# Patient Record
Sex: Female | Born: 2000
Health system: Southern US, Community
[De-identification: ages and names within clinical notes are randomized; demographics above are authoritative.]

## PROBLEM LIST (undated history)

## (undated) DIAGNOSIS — R51 Headache: Secondary | ICD-10-CM

## (undated) DIAGNOSIS — E0500 Thyrotoxicosis with diffuse goiter without thyrotoxic crisis or storm: Secondary | ICD-10-CM

## (undated) DIAGNOSIS — T7840XA Allergy, unspecified, initial encounter: Secondary | ICD-10-CM

## (undated) HISTORY — DX: Allergy, unspecified, initial encounter: T78.40XA

## (undated) HISTORY — DX: Thyrotoxicosis with diffuse goiter without thyrotoxic crisis or storm: E05.00

## (undated) HISTORY — DX: Headache: R51

---
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
## 2019-01-16 ENCOUNTER — Other Ambulatory Visit (INDEPENDENT_AMBULATORY_CARE_PROVIDER_SITE_OTHER): Payer: Federal, State, Local not specified - PPO

## 2019-01-16 ENCOUNTER — Other Ambulatory Visit: Payer: Self-pay

## 2019-01-16 DIAGNOSIS — E0590 Thyrotoxicosis, unspecified without thyrotoxic crisis or storm: Secondary | ICD-10-CM | POA: Diagnosis not present

## 2019-01-16 LAB — T4, FREE: Free T4: 0.56 ng/dL — ABNORMAL LOW (ref 0.60–1.60)

## 2019-01-16 LAB — TSH: TSH: 1.65 u[IU]/mL (ref 0.40–5.00)

## 2019-01-19 MED ORDER — METHIMAZOLE 10 MG PO TABS: 10.0000 mg | ORAL_TABLET | Freq: Every day | ORAL | 6 refills | Status: DC

## 2019-02-03 ENCOUNTER — Telehealth: Payer: Self-pay

## 2019-02-03 NOTE — Telephone Encounter (Signed)
Patients mother called in needing a letter written about her condition and diagnose , so she can be able to do remote learning.   Please call and advise for more information

## 2019-02-04 ENCOUNTER — Encounter: Payer: Self-pay | Admitting: Internal Medicine

## 2019-02-04 NOTE — Telephone Encounter (Signed)
Attempted to reach mother. No answer and vm is full. Pt has the same # as mother. Will place letter up front

## 2019-02-05 ENCOUNTER — Telehealth: Payer: Self-pay | Admitting: Internal Medicine

## 2019-02-05 ENCOUNTER — Encounter: Payer: Self-pay | Admitting: Internal Medicine

## 2019-02-05 NOTE — Telephone Encounter (Signed)
Patient's mother [name] ph# [phone] requests to be called re: status of Letter for school re: patient's health condition. [name] has not heard back from our office.

## 2019-02-05 NOTE — Telephone Encounter (Signed)
Called mother because we tried to call yesterday. She is aware that letter was placed up front.

## 2019-02-05 NOTE — Telephone Encounter (Signed)
Closing this encounter please see other telephone encounter

## 2019-02-11 DIAGNOSIS — R430 Anosmia: Secondary | ICD-10-CM | POA: Diagnosis not present

## 2019-02-11 DIAGNOSIS — R05 Cough: Secondary | ICD-10-CM | POA: Diagnosis not present

## 2019-02-18 ENCOUNTER — Telehealth: Payer: Self-pay | Admitting: Family Medicine

## 2019-02-18 NOTE — Telephone Encounter (Signed)
Please advise 

## 2019-02-18 NOTE — Telephone Encounter (Signed)
Copied from [place] [id number]. Topic: General - Other >> Feb 18, 2019 11:00 AM Berneta Levins wrote: Reason for CRM:   Pt's mother calling.  Pt was tested for COVID on 11/25 and positive result on 11/28.  Pt's mother states that pt is having loss of taste and smell at this time and wants to know if there are any recommendations by PCP. [name] can be reached at [phone]

## 2019-02-19 NOTE — Telephone Encounter (Signed)
There is really nothing more she can do except get plenty of rest and wait this out. However she does need to quarantine herself for 14 days after the positive test.

## 2019-02-20 NOTE — Telephone Encounter (Signed)
Pt mother notified of update. Mother stated stated pt has no fever still but other sx.

## 2019-02-22 DIAGNOSIS — Z20828 Contact with and (suspected) exposure to other viral communicable diseases: Secondary | ICD-10-CM | POA: Diagnosis not present

## 2019-03-04 ENCOUNTER — Other Ambulatory Visit: Payer: Self-pay

## 2019-03-06 ENCOUNTER — Ambulatory Visit: Payer: Federal, State, Local not specified - PPO | Admitting: Internal Medicine

## 2019-03-06 VITALS — BP 118/78 | HR 68 | Temp 98.8°F | Ht 66.0 in | Wt 154.8 lb

## 2019-03-06 DIAGNOSIS — E0500 Thyrotoxicosis with diffuse goiter without thyrotoxic crisis or storm: Secondary | ICD-10-CM

## 2019-03-06 DIAGNOSIS — E0590 Thyrotoxicosis, unspecified without thyrotoxic crisis or storm: Secondary | ICD-10-CM | POA: Diagnosis not present

## 2019-03-06 NOTE — Progress Notes (Signed)
Name: Ruth Phillips  MRN/ DOB: [id number], 2000-05-11    Age/ Sex: 18 y.o., female     PCP: Laurey Morale, MD   Reason for Endocrinology Evaluation: Hyperthyroidism     Initial Endocrinology Clinic Visit: 10/13/2018    PATIENT IDENTIFIER: Ruth Phillips is a 18 y.o., female with unremarkable past medical history. She has followed with [facility] since 10/13/2018 for consultative assistance with management of her hyperthyroidism   HISTORICAL SUMMARY:  The patient was first diagnosed with hyperthyroidism Secondary to Graves' Disease in 09/2018  after she noted an enlarging thyroid gland a few months prior to her presentation.   A thyroid ultrasound on 09/25/2018 shows a heterogenous gland with a left inferior nodule , separate from the thyroid gland, a CT scan of the neck demonstrated a17 x 21 mm soft tissue nodule anterior mediastinum just above the left innominate vein and below the left lobe of the thyroid corresponds to the ultrasound abnormality. This was deemed to be a thymus gland.   Methimazole was started 09/2018 Mother with Grave's disease S/P RAI , maternal Grandfather with hyperthyroidism    SUBJECTIVE:   During last visit (10/13/2018): Reduced Methimazole dose to 15 mg (down from 20 mg)   Today (03/09/2019):  Ruth Phillips is here for a follow up on hyperthyroidism secondary to Graves' Disease. She has been taking 1 tablet of methimazole 10 mg tabs. She has been compliant with this.  She denies any side effects such as fever, vomiting or abdominal pain. She continues with irregular BM.  She denies any palpitations.  No anxiety but has occasional tremors in her hands Weight has been stable  No local neck symptoms   ROS:  As per HPI.   HISTORY:  Past Medical History:  Past Medical History:  Diagnosis Date  . Allergy   . WERXVQMG(867.6)     Past Surgical History: No past surgical history on file.  Social History:  reports that she has never  smoked. She has never used smokeless tobacco. She reports that she does not drink alcohol or use drugs. Family History:  Family History  Problem Relation Age of Onset  . Diabetes Mother   . Thyroid disease Mother   . Asthma Father      HOME MEDICATIONS: Allergies as of 03/06/2019   No Known Allergies     Medication List       Accurate as of March 06, 2019 11:59 PM. If you have any questions, ask your nurse or doctor.        methimazole 10 MG tablet Commonly known as: TAPAZOLE Take 1 tablet (10 mg total) by mouth daily.   XYZAL PO Take 1 tablet by mouth at bedtime.         OBJECTIVE:   PHYSICAL EXAM: VS: BP 118/78 (BP Location: Left Arm, Patient Position: Sitting, Cuff Size: Normal)   Pulse 68   Temp 98.8 F (37.1 C)   Ht 5\' 6"  (1.676 m)   Wt 154 lb 12.8 oz (70.2 kg)   SpO2 98%   BMI 24.99 kg/m    EXAM: General: Pt appears well and is in NAD  Neck: General: Supple without adenopathy. Thyroid: Thyroid size ~ 40 grams .  No nodules appreciated.   Lungs: Clear with good BS bilat with no rales, rhonchi, or wheezes  Heart: Auscultation: RRR.  Abdomen: Normoactive bowel sounds, soft, nontender, without masses or organomegaly palpable  Extremities:  BL LE: No pretibial edema normal ROM and strength.  Mental Status: Judgment, insight: Intact Orientation: Oriented to time, place, and person Mood and affect: No depression, anxiety, or agitation     DATA REVIEWED:  Results for CHENEL, WERNLI (MRN [id number]) as of 03/09/2019 09:26  Ref. Range 03/06/2019 15:33  TSH Latest Units: mIU/L 2.73  T4,Free(Direct) Latest Ref Range: 0.8 - 1.4 ng/dL 0.9    Results for ELSY, CHIANG (MRN [id number]) as of 12/08/2018 10:46  Ref. Range 10/10/2018 15:11  TRAB Latest Ref Range: <=2.00 IU/L 11.36 (H)    ASSESSMENT / PLAN / RECOMMENDATIONS:   1. Hyperthyroidism Secondary to Graves' Disease:    - Clinically she is euthyroid  - Tolerating methimazole well  -  Today's labs show normal TSh and FT4, will continue current dose of methimazole   Medications   Methimazole 10 mg daily   2. Graves' Disease:  - No extra thyroidal manifestations of graves' disease.     Labs in 8 weeks   F/U in 4 months    Signed electronically by: Lyndle Herrlich, MD  [facility] Endocrinology  [facility] [address] [place] [place], [place] [address] Phone: [phone] FAX: [phone]      CC: Nelwyn Salisbury, MD [address] [place] [address] Phone: [phone]  Fax: [phone]   Return to Endocrinology clinic as below: Future Appointments  Date Time Provider Department Center  05/01/2019  2:00 PM [facility] None  07/10/2019  2:00 PM Leatta Alewine, Konrad Dolores, MD [facility] None

## 2019-03-06 NOTE — Patient Instructions (Signed)
-   Continue Methimazole 10 mg , at one tablet daily until we contact you next week with the results

## 2019-03-07 LAB — T4, FREE: Free T4: 0.9 ng/dL (ref 0.8–1.4)

## 2019-03-07 LAB — TSH: TSH: 2.73 mIU/L

## 2019-03-09 ENCOUNTER — Encounter: Payer: Self-pay | Admitting: Internal Medicine

## 2019-03-09 MED ORDER — METHIMAZOLE 10 MG PO TABS: 10.0000 mg | ORAL_TABLET | Freq: Every day | ORAL | 3 refills | Status: DC

## 2019-05-01 ENCOUNTER — Other Ambulatory Visit: Payer: Self-pay

## 2019-05-01 ENCOUNTER — Other Ambulatory Visit (INDEPENDENT_AMBULATORY_CARE_PROVIDER_SITE_OTHER): Payer: Federal, State, Local not specified - PPO

## 2019-05-01 DIAGNOSIS — E0590 Thyrotoxicosis, unspecified without thyrotoxic crisis or storm: Secondary | ICD-10-CM | POA: Diagnosis not present

## 2019-05-02 LAB — T4, FREE: Free T4: 0.9 ng/dL (ref 0.8–1.4)

## 2019-05-02 LAB — TSH: TSH: 6.36 mIU/L — ABNORMAL HIGH

## 2019-05-04 ENCOUNTER — Telehealth: Payer: Self-pay | Admitting: Internal Medicine

## 2019-05-04 MED ORDER — METHIMAZOLE 5 MG PO TABS: 5.0000 mg | ORAL_TABLET | Freq: Every day | ORAL | 4 refills | Status: DC

## 2019-05-04 NOTE — Telephone Encounter (Signed)
The current dose of methimazole is a bit too much for you. Please start taking half a tablet of the current dose of methimazole 10 mg, once you are done with this bottle , the new prescription will be the 5 mg dose and please start taking ONE tablet daily of that bottle.      Abby Raelyn Mora, MD  [facility] Endocrinology  [facility] [address] [place], [place] [address] Phone: [phone] FAX: [phone]

## 2019-06-20 ENCOUNTER — Ambulatory Visit: Payer: Federal, State, Local not specified - PPO | Attending: Internal Medicine

## 2019-06-20 DIAGNOSIS — Z23 Encounter for immunization: Secondary | ICD-10-CM

## 2019-06-20 NOTE — Progress Notes (Signed)
   Covid-19 Vaccination Clinic  Name:  Ruth Phillips    MRN: [id number] DOB: [birth year]  06/20/2019  [name] was observed post Covid-19 immunization for 15 minutes without incident. She was provided with Vaccine Information Sheet and instruction to access the V-Safe system.   [name] was instructed to call 911 with any severe reactions post vaccine: Marland Kitchen Difficulty breathing  . Swelling of face and throat  . A fast heartbeat  . A bad rash all over body  . Dizziness and weakness   Immunizations Administered    Name Date Dose VIS Date Route   Pfizer COVID-19 Vaccine 06/20/2019  9:34 AM 0.3 mL 02/27/2019 Intramuscular   Manufacturer: [employer], [employer]   Lot: [id number]   NDC: [address]-5

## 2019-07-10 ENCOUNTER — Other Ambulatory Visit: Payer: Self-pay

## 2019-07-10 ENCOUNTER — Encounter: Payer: Self-pay | Admitting: Internal Medicine

## 2019-07-10 ENCOUNTER — Ambulatory Visit: Payer: Federal, State, Local not specified - PPO | Admitting: Internal Medicine

## 2019-07-10 VITALS — BP 122/80 | HR 64 | Temp 98.2°F | Ht 66.0 in | Wt 150.6 lb

## 2019-07-10 DIAGNOSIS — E0590 Thyrotoxicosis, unspecified without thyrotoxic crisis or storm: Secondary | ICD-10-CM | POA: Diagnosis not present

## 2019-07-10 LAB — T4, FREE: Free T4: 0.87 ng/dL (ref 0.60–1.60)

## 2019-07-10 LAB — TSH: TSH: 1.98 u[IU]/mL (ref 0.40–5.00)

## 2019-07-10 NOTE — Progress Notes (Signed)
Name: Ruth Phillips  MRN/ DOB: [id number], 2000/04/29    Age/ Sex: 19 y.o., female     PCP: Nelwyn Salisbury, MD   Reason for Endocrinology Evaluation: Hyperthyroidism     Initial Endocrinology Clinic Visit: 10/13/2018    PATIENT IDENTIFIER: Ruth Phillips is a 19 y.o., female, female with unremarkable past medical history. She has followed with [facility] since 10/13/2018 for consultative assistance with management of her hyperthyroidism   HISTORICAL SUMMARY:  The patient was first diagnosed with hyperthyroidism Secondary to Graves' Disease in 09/2018  after she noted an enlarging thyroid gland a few months prior to her presentation.   A thyroid ultrasound on 09/25/2018 shows a heterogenous gland with a left inferior nodule , separate from the thyroid gland, a CT scan of the neck demonstrated a17 x 21 mm soft tissue nodule anterior mediastinum just above the left innominate vein and below the left lobe of the thyroid corresponds to the ultrasound abnormality. This was deemed to be a thymus gland.   Methimazole was started 09/2018 Mother with Grave's disease S/P RAI , maternal Grandfather with hyperthyroidism    SUBJECTIVE:   During last visit (03/08/2019): Reduced Methimazole dose to 5 mg   Today (07/10/2019):  Ruth Phillips is here for a follow up on hyperthyroidism secondary to Graves' Disease. She has been taking 1 tablet of methimazole 5 mg tabs. She has been compliant with this.  Has noted metallic taste  That she attributes to methimazole  Has noted decreased appetite with palpitations  Denies diarrhea  Has jittery sensation  Has noted increase in anterior neck swelling   ROS:  As per HPI.   HISTORY:  Past Medical History:  Past Medical History:  Diagnosis Date  . Allergy   . FUXNATFT(732.2)     Past Surgical History: No past surgical history on file.  Social History:  reports that she has never smoked. She has never used smokeless tobacco. She reports that  she does not drink alcohol or use drugs. Family History:  Family History  Problem Relation Age of Onset  . Diabetes Mother   . Thyroid disease Mother   . Asthma Father      HOME MEDICATIONS: Allergies as of 07/10/2019   No Known Allergies     Medication List       Accurate as of July 10, 2019  4:23 PM. If you have any questions, ask your nurse or doctor.        methimazole 5 MG tablet Commonly known as: TAPAZOLE Take 1 tablet (5 mg total) by mouth daily.   XYZAL PO Take 1 tablet by mouth at bedtime.         OBJECTIVE:   PHYSICAL EXAM: VS: BP 122/80 (BP Location: Left Arm, Patient Position: Sitting, Cuff Size: Normal)   Pulse 64   Temp 98.2 F (36.8 C)   Ht 5\' 6"  (1.676 m)   Wt 150 lb 9.6 oz (68.3 kg)   SpO2 96%   BMI 24.31 kg/m    EXAM: General: Pt appears well and is in NAD  Neck: General: Supple without adenopathy. Thyroid: Thyroid size ~ 40 grams .  No nodules appreciated.   Lungs: Clear with good BS bilat with no rales, rhonchi, or wheezes  Heart: Auscultation: RRR.  Abdomen: Normoactive bowel sounds, soft, nontender, without masses or organomegaly palpable  Extremities:  BL LE: No pretibial edema normal ROM and strength.  Mental Status: Judgment, insight: Intact Orientation: Oriented to time, place, and person  Mood and affect: No depression, anxiety, or agitation     DATA REVIEWED:  Results for JANNETH, KRASNER (MRN [id number]) as of 03/09/2019 09:26  Ref. Range 03/06/2019 15:33  TSH Latest Units: mIU/L 2.73  T4,Free(Direct) Latest Ref Range: 0.8 - 1.4 ng/dL 0.9    Results for CHANDELL, ATTRIDGE (MRN [id number]) as of 12/08/2018 10:46  Ref. Range 10/10/2018 15:11  TRAB Latest Ref Range: <=2.00 IU/L 11.36 (H)    ASSESSMENT / PLAN / RECOMMENDATIONS:   1. Hyperthyroidism Secondary to Graves' Disease:   -Patient is having a metallic taste in her mouth that she attributes to methimazole - Today's labs show normal TFTs -I have advised her to  take the methimazole with a meal, and see if that would help with the metallic taste.  If she continues to have an issue with a metallic taste, the other option is to switch to PTU.  We did discuss that PTU has a high risk of hepatic injury compared to methimazole.  Medications   Methimazole 5 mg daily   2. Graves' Disease:  - No extra thyroidal manifestations of graves' disease.     Labs in 8 weeks   F/U in 4 months    Signed electronically by: Mack Guise, MD  [facility] Endocrinology  [facility] [address] [place], [place] [address] Phone: [phone] FAX: [phone]      CC: Laurey Morale, [place] [address] Phone: [phone]  Fax: [phone]   Return to Endocrinology clinic as below: Future Appointments  Date Time Provider [place]  07/15/2019  2:00 PM [place] [facility]  09/09/2019  2:15 PM [facility] None  11/13/2019  3:40 PM Danasia Baker, Melanie Crazier, MD [facility] None

## 2019-07-10 NOTE — Patient Instructions (Signed)
-   Continue Methimazole 5 mg, daily  

## 2019-07-15 ENCOUNTER — Ambulatory Visit: Payer: Federal, State, Local not specified - PPO | Attending: Internal Medicine

## 2019-07-15 DIAGNOSIS — Z23 Encounter for immunization: Secondary | ICD-10-CM

## 2019-07-15 NOTE — Progress Notes (Signed)
   Covid-19 Vaccination Clinic  Name:  Ruth Phillips    MRN: [id number] DOB: [birth year]  07/15/2019  [name] was observed post Covid-19 immunization for 15 minutes without incident. She was provided with Vaccine Information Sheet and instruction to access the V-Safe system.   [name] was instructed to call 911 with any severe reactions post vaccine: Marland Kitchen Difficulty breathing  . Swelling of face and throat  . A fast heartbeat  . A bad rash all over body  . Dizziness and weakness   Immunizations Administered    Name Date Dose VIS Date Route   Pfizer COVID-19 Vaccine 07/15/2019  2:12 PM 0.3 mL 05/13/2018 Intramuscular   Manufacturer: [employer], [employer]   Lot: [id number]   NDC: [address]-2

## 2019-08-21 DIAGNOSIS — K080 Exfoliation of teeth due to systemic causes: Secondary | ICD-10-CM | POA: Diagnosis not present

## 2019-08-27 ENCOUNTER — Ambulatory Visit (INDEPENDENT_AMBULATORY_CARE_PROVIDER_SITE_OTHER): Payer: Federal, State, Local not specified - PPO | Admitting: Family Medicine

## 2019-08-27 ENCOUNTER — Other Ambulatory Visit (INDEPENDENT_AMBULATORY_CARE_PROVIDER_SITE_OTHER): Payer: Federal, State, Local not specified - PPO

## 2019-08-27 ENCOUNTER — Encounter: Payer: Self-pay | Admitting: Family Medicine

## 2019-08-27 ENCOUNTER — Other Ambulatory Visit: Payer: Self-pay

## 2019-08-27 VITALS — BP 120/64 | HR 109 | Temp 97.1°F | Wt 152.6 lb

## 2019-08-27 DIAGNOSIS — Z0000 Encounter for general adult medical examination without abnormal findings: Secondary | ICD-10-CM

## 2019-08-27 DIAGNOSIS — Z23 Encounter for immunization: Secondary | ICD-10-CM | POA: Diagnosis not present

## 2019-08-27 LAB — CBC WITH DIFFERENTIAL/PLATELET
Basophils Absolute: 0 10*3/uL (ref 0.0–0.1)
Basophils Relative: 0.4 % (ref 0.0–3.0)
Eosinophils Absolute: 0.3 10*3/uL (ref 0.0–0.7)
Eosinophils Relative: 4.8 % (ref 0.0–5.0)
HCT: 38.5 % (ref 36.0–49.0)
Hemoglobin: 13.4 g/dL (ref 12.0–16.0)
Lymphocytes Relative: 33.9 % (ref 24.0–48.0)
Lymphs Abs: 2.4 10*3/uL (ref 0.7–4.0)
MCHC: 35 g/dL (ref 31.0–37.0)
MCV: 88.2 fl (ref 78.0–98.0)
Monocytes Absolute: 0.7 10*3/uL (ref 0.1–1.0)
Monocytes Relative: 9.6 % (ref 3.0–12.0)
Neutro Abs: 3.6 10*3/uL (ref 1.4–7.7)
Neutrophils Relative %: 51.3 % (ref 43.0–71.0)
Platelets: 252 10*3/uL (ref 150.0–575.0)
RBC: 4.36 Mil/uL (ref 3.80–5.70)
RDW: 12.8 % (ref 11.4–15.5)
WBC: 7 10*3/uL (ref 4.5–13.5)

## 2019-08-27 LAB — HEPATIC FUNCTION PANEL
ALT: 10 U/L (ref 0–35)
AST: 16 U/L (ref 0–37)
Albumin: 4.6 g/dL (ref 3.5–5.2)
Alkaline Phosphatase: 65 U/L (ref 47–119)
Bilirubin, Direct: 0.1 mg/dL (ref 0.0–0.3)
Total Bilirubin: 0.3 mg/dL (ref 0.2–1.2)
Total Protein: 7.6 g/dL (ref 6.0–8.3)

## 2019-08-27 LAB — LIPID PANEL
Cholesterol: 137 mg/dL (ref 0–200)
HDL: 46.4 mg/dL (ref >39.00)
LDL Cholesterol: 78 mg/dL (ref 0–99)
NonHDL: 90.29
Total CHOL/HDL Ratio: 3
Triglycerides: 62 mg/dL (ref 0.0–149.0)
VLDL: 12.4 mg/dL (ref 0.0–40.0)

## 2019-08-27 LAB — BASIC METABOLIC PANEL
BUN: 12 mg/dL (ref 6–23)
CO2: 25 mEq/L (ref 19–32)
Calcium: 9.5 mg/dL (ref 8.4–10.5)
Chloride: 103 mEq/L (ref 96–112)
Creatinine, Ser: 0.8 mg/dL (ref 0.40–1.20)
GFR: 111.15 mL/min (ref >60.00)
Glucose, Bld: 95 mg/dL (ref 70–99)
Potassium: 3.5 mEq/L (ref 3.5–5.1)
Sodium: 135 mEq/L (ref 135–145)

## 2019-08-27 NOTE — Addendum Note (Signed)
Addended by: Solon Augusta on: 08/27/2019 08:34 AM   Modules accepted: Orders

## 2019-08-27 NOTE — Progress Notes (Signed)
Subjective:    Patient ID: Ruth Phillips, female    DOB: 05/05/2000, 19 y.o.   MRN: [id number]  HPI Here with her mother for a well exam. She feels great in general. She saw Dr. Lonzo Cloud in April for the Grave's disease, and she is doing well. Her weight is stable and all her thyroid levels are normal. However the Methimazole causes an intense metallic taste in her mouth. 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 Her menses are regular. She enjoys yoga and pilates.    Review of Systems  Constitutional: Negative.   HENT: Negative.   Eyes: Negative.   Respiratory: Negative.   Cardiovascular: Negative.   Gastrointestinal: Negative.   Genitourinary: Negative for decreased urine volume, difficulty urinating, dyspareunia, dysuria, enuresis, flank pain, frequency, hematuria, pelvic pain and urgency.  Musculoskeletal: Negative.   Skin: Negative.   Neurological: Negative.   Psychiatric/Behavioral: Negative.        Objective:   Physical Exam Constitutional:      General: She is not in acute distress.    Appearance: She is well-developed.  HENT:     Head: Normocephalic and atraumatic.     Right Ear: External ear normal.     Left Ear: External ear normal.     Nose: Nose normal.     Mouth/Throat:     Pharynx: No oropharyngeal exudate.  Eyes:     General: No scleral icterus.    Conjunctiva/sclera: Conjunctivae normal.     Pupils: Pupils are equal, round, and reactive to light.  Neck:     Thyroid: No thyromegaly.     Vascular: No JVD.     Comments: Her goiter is stable, not tender  Cardiovascular:     Rate and Rhythm: Normal rate and regular rhythm.     Heart sounds: Normal heart sounds. No murmur heard.  No friction rub. No gallop.   Pulmonary:     Effort: Pulmonary effort is normal. No respiratory distress.     Breath sounds: Normal breath sounds. No wheezing or rales.  Chest:     Chest wall: No tenderness.  Abdominal:      General: Bowel sounds are normal. There is no distension.     Palpations: Abdomen is soft. There is no mass.     Tenderness: There is no abdominal tenderness. There is no guarding or rebound.  Musculoskeletal:        General: No tenderness. Normal range of motion.     Cervical back: Normal range of motion and neck supple.  Lymphadenopathy:     Cervical: No cervical adenopathy.  Skin:    General: Skin is warm and dry.     Findings: No erythema or rash.  Neurological:     Mental Status: She is alert and oriented to person, place, and time.     Cranial Nerves: No cranial nerve deficit.     Motor: No abnormal muscle tone.     Coordination: Coordination normal.     Deep Tendon Reflexes: Reflexes are normal and symmetric. Reflexes normal.  Psychiatric:        Behavior: Behavior normal.        Thought Content: Thought content normal.        Judgment: Judgment normal.           Assessment & Plan:  Well exam. We discussed diet and exercise. Get fasting labs. Given her second HPV vaccine. I advised her to talk  to Dr. Kelton Pillar about changing to PTU.  Alysia Penna, MD

## 2019-09-09 ENCOUNTER — Other Ambulatory Visit: Payer: Self-pay

## 2019-09-09 ENCOUNTER — Other Ambulatory Visit (INDEPENDENT_AMBULATORY_CARE_PROVIDER_SITE_OTHER): Payer: Federal, State, Local not specified - PPO

## 2019-09-09 DIAGNOSIS — E0590 Thyrotoxicosis, unspecified without thyrotoxic crisis or storm: Secondary | ICD-10-CM

## 2019-09-09 LAB — TSH: TSH: 1.64 u[IU]/mL (ref 0.40–5.00)

## 2019-09-09 LAB — T4, FREE: Free T4: 0.9 ng/dL (ref 0.60–1.60)

## 2019-09-16 ENCOUNTER — Telehealth: Payer: Self-pay | Admitting: Internal Medicine

## 2019-09-16 MED ORDER — PROPYLTHIOURACIL 50 MG PO TABS: 50.0000 mg | ORAL_TABLET | Freq: Every day | ORAL | 6 refills | Status: DC

## 2019-09-16 NOTE — Telephone Encounter (Signed)
Mother calling to let us know pt is asking for [place] to place an ENT referral and to change her medication to a PTU medication.  Mother is on DPR  Mother states She was told by Dr. Lonzo Cloud that if this was something the patient wanted we would do it.

## 2019-09-16 NOTE — Telephone Encounter (Signed)
Attempted to call mother # twice and no answer also her mailbox is full.

## 2019-09-17 NOTE — Telephone Encounter (Signed)
Patient's mother called stating she had not gotten a call yet and has been waiting. Informed her that [name] has tried twice to call and no voicemail. Ph# [phone]

## 2019-09-18 NOTE — Telephone Encounter (Signed)
Spoke to pt's mother and informed her of new medication and that her daughter would need to see PCP for referral to ENT.

## 2019-11-13 ENCOUNTER — Ambulatory Visit: Payer: Federal, State, Local not specified - PPO | Admitting: Internal Medicine

## 2019-11-13 ENCOUNTER — Encounter: Payer: Self-pay | Admitting: Internal Medicine

## 2019-11-13 ENCOUNTER — Other Ambulatory Visit: Payer: Self-pay

## 2019-11-13 VITALS — BP 118/62 | HR 68 | Ht 66.0 in | Wt 147.0 lb

## 2019-11-13 DIAGNOSIS — E0590 Thyrotoxicosis, unspecified without thyrotoxic crisis or storm: Secondary | ICD-10-CM

## 2019-11-13 DIAGNOSIS — E0500 Thyrotoxicosis with diffuse goiter without thyrotoxic crisis or storm: Secondary | ICD-10-CM

## 2019-11-13 NOTE — Patient Instructions (Signed)
-   Continue PTU 50 mg, 1 tablet daily for now

## 2019-11-13 NOTE — Progress Notes (Signed)
Name: Ruth Phillips  MRN/ DOB: [id number], 03-Sep-2000    Age/ Sex: 19 y.o., female     PCP: Nelwyn Salisbury, MD   Reason for Endocrinology Evaluation: Hyperthyroidism     Initial Endocrinology Clinic Visit: 10/13/2018    PATIENT IDENTIFIER: Ruth Phillips is a 20 y.o., female with unremarkable past medical history. She has followed with [facility] since 10/13/2018 for consultative assistance with management of her hyperthyroidism   HISTORICAL SUMMARY:  The patient was first diagnosed with hyperthyroidism Secondary to Graves' Disease in 09/2018  after she noted an enlarging thyroid gland a few months prior to her presentation.   A thyroid ultrasound on 09/25/2018 shows a heterogenous gland with a left inferior nodule , separate from the thyroid gland, a CT scan of the neck demonstrated a17 x 21 mm soft tissue nodule anterior mediastinum just above the left innominate vein and below the left lobe of the thyroid corresponds to the ultrasound abnormality. This was deemed to be a thymus gland.   Methimazole was started 09/2018 , switched to PTU in 08/2019 due to metalic taste Mother with Grave's disease S/P RAI , maternal Grandfather with hyperthyroidism    SUBJECTIVE:    Today (11/13/2019):  Ruth Phillips is here for a follow up on hyperthyroidism secondary to Graves' Disease. She has been taking 1 tablet of PTU.  She has been compliant with this.   Weight has been stable    Denies palpitations Occasional abdominal pain , and diarrhea started 2 months ago  Denies anxiety  Denies neck swelling.     Studies political science  HISTORY:  Past Medical History:  Past Medical History:  Diagnosis Date   Allergy    Headache(784.0)     Past Surgical History: No past surgical history on file.  Social History:  reports that she has never smoked. She has never used smokeless tobacco. She reports that she does not drink alcohol and does not use drugs. Family History:   Family History  Problem Relation Age of Onset   Diabetes Mother    Thyroid disease Mother    Asthma Father      HOME MEDICATIONS: Allergies as of 11/13/2019   No Known Allergies     Medication List       Accurate as of November 13, 2019  3:56 PM. If you have any questions, ask your nurse or doctor.        propylthiouracil 50 MG tablet Commonly known as: PTU Take 1 tablet (50 mg total) by mouth daily.   XYZAL PO Take 1 tablet by mouth at bedtime.         OBJECTIVE:   PHYSICAL EXAM: VS: There were no vitals taken for this visit.   EXAM: General: Pt appears well and is in NAD  Neck: General: Supple without adenopathy. Thyroid: Thyroid size ~ 40 grams .  No nodules appreciated.   Lungs: Clear with good BS bilat with no rales, rhonchi, or wheezes  Heart: Auscultation: RRR.  Abdomen: Normoactive bowel sounds, soft, nontender, without masses or organomegaly palpable  Extremities:  BL LE: No pretibial edema normal ROM and strength.  Mental Status: Judgment, insight: Intact Orientation: Oriented to time, place, and person Mood and affect: No depression, anxiety, or agitation     DATA REVIEWED: Results for Ruth Phillips, Ruth Phillips (MRN [id number]) as of 11/15/2019 14:30  Ref. Range 11/13/2019 16:38  Sodium Latest Ref Range: 135 - 146 mmol/L 141  Potassium Latest Ref Range: 3.8 - 5.1 mmol/L  3.7 (L)  Chloride Latest Ref Range: 98 - 110 mmol/L 105  CO2 Latest Ref Range: 20 - 32 mmol/L 26  Glucose Latest Ref Range: 65 - 99 mg/dL 81  BUN Latest Ref Range: 7 - 20 mg/dL 8  Creatinine Latest Ref Range: 0.50 - 1.00 mg/dL 9.32  Calcium Latest Ref Range: 8.9 - 10.4 mg/dL 9.5  BUN/Creatinine Ratio Latest Ref Range: 6 - 22 (calc) NOT APPLICABLE  AG Ratio Latest Ref Range: 1.0 - 2.5 (calc) 1.8  AST Latest Ref Range: 12 - 32 U/L 18  ALT Latest Ref Range: 5 - 32 U/L 12  Total Protein Latest Ref Range: 6.3 - 8.2 g/dL 7.2  Total Bilirubin Latest Ref Range: 0.2 - 1.1 mg/dL 0.6  Alkaline  phosphatase (APISO) Latest Ref Range: 36 - 128 U/L 50  Globulin Latest Ref Range: 2.0 - 3.8 g/dL (calc) 2.6  TSH Latest Units: mIU/L 0.94  T4,Free(Direct) Latest Ref Range: 0.8 - 1.4 ng/dL 1.3  Albumin MSPROF Latest Ref Range: 3.6 - 5.1 g/dL 4.6     Results for Ruth Phillips, Ruth Phillips (MRN [id number]) as of 12/08/2018 10:46  Ref. Range 10/10/2018 15:11  TRAB Latest Ref Range: <=2.00 IU/L 11.36 (H)    ASSESSMENT / PLAN / RECOMMENDATIONS:   1. Hyperthyroidism Secondary to Graves' Disease:   -Patient is having a metallic taste in her mouth that she attributes to methimazole - Today's labs show normal TFTs -I have advised her to take the methimazole with a meal, and see if that would help with the metallic taste.  If she continues to have an issue with a metallic taste, the other option is to switch to PTU.  We did discuss that PTU has a high risk of hepatic injury compared to methimazole.  Medications  Continue PTU 50 mg daily      2. Graves' Disease:  - No extra thyroidal manifestations of graves' disease.     Labs in 8 weeks   F/U in 4 months    Signed electronically by: Lyndle Herrlich, MD  [facility] Endocrinology  [facility] [address] [place] [place], [place] [address] Phone: [phone] FAX: [phone]      CC: Nelwyn Salisbury, MD [address] [place] [address] Phone: [phone]  Fax: [phone]   Return to Endocrinology clinic as below: No future appointments.

## 2019-11-14 LAB — COMPREHENSIVE METABOLIC PANEL
AG Ratio: 1.8 (calc) (ref 1.0–2.5)
ALT: 12 U/L (ref 5–32)
AST: 18 U/L (ref 12–32)
Albumin: 4.6 g/dL (ref 3.6–5.1)
Alkaline phosphatase (APISO): 50 U/L (ref 36–128)
BUN: 8 mg/dL (ref 7–20)
CO2: 26 mmol/L (ref 20–32)
Calcium: 9.5 mg/dL (ref 8.9–10.4)
Chloride: 105 mmol/L (ref 98–110)
Creat: 0.76 mg/dL (ref 0.50–1.00)
Globulin: 2.6 g/dL (calc) (ref 2.0–3.8)
Glucose, Bld: 81 mg/dL (ref 65–99)
Potassium: 3.7 mmol/L — ABNORMAL LOW (ref 3.8–5.1)
Sodium: 141 mmol/L (ref 135–146)
Total Bilirubin: 0.6 mg/dL (ref 0.2–1.1)
Total Protein: 7.2 g/dL (ref 6.3–8.2)

## 2019-11-14 LAB — TSH: TSH: 0.94 mIU/L

## 2019-11-14 LAB — T4, FREE: Free T4: 1.3 ng/dL (ref 0.8–1.4)

## 2019-11-15 MED ORDER — PROPYLTHIOURACIL 50 MG PO TABS
50.0000 mg | ORAL_TABLET | Freq: Every day | ORAL | 1 refills | Status: DC
Start: 2019-11-15 — End: 2020-08-01

## 2019-11-16 ENCOUNTER — Telehealth: Payer: Self-pay | Admitting: Internal Medicine

## 2019-11-16 NOTE — Telephone Encounter (Signed)
Patient's mother Almedia Balls requests to be called at ph# [phone] re: concerns after receiving Lab Results on MyChart.

## 2019-11-16 NOTE — Telephone Encounter (Signed)
Called pt's mother and read her Dr. Harvel Ricks results note. Pt's mother stated that the daughter did not tell her that this note was there, but suggested that she did not see the labs. Pt's mother verbalized understanding, and stated that she would check with the pt again to see if the note from Dr. Lonzo Cloud is visible to her in Mychart.

## 2020-01-15 ENCOUNTER — Other Ambulatory Visit: Payer: Self-pay

## 2020-01-15 ENCOUNTER — Other Ambulatory Visit (INDEPENDENT_AMBULATORY_CARE_PROVIDER_SITE_OTHER): Payer: Federal, State, Local not specified - PPO

## 2020-01-15 DIAGNOSIS — E0500 Thyrotoxicosis with diffuse goiter without thyrotoxic crisis or storm: Secondary | ICD-10-CM

## 2020-01-15 LAB — TSH: TSH: 0.66 u[IU]/mL (ref 0.40–5.00)

## 2020-01-15 LAB — T4, FREE: Free T4: 0.96 ng/dL (ref 0.60–1.60)

## 2020-03-25 ENCOUNTER — Ambulatory Visit: Payer: Federal, State, Local not specified - PPO | Admitting: Internal Medicine

## 2020-03-25 ENCOUNTER — Other Ambulatory Visit: Payer: Self-pay

## 2020-03-25 ENCOUNTER — Encounter: Payer: Self-pay | Admitting: Internal Medicine

## 2020-03-25 VITALS — BP 110/74 | HR 64 | Ht 66.0 in | Wt 138.2 lb

## 2020-03-25 DIAGNOSIS — E0590 Thyrotoxicosis, unspecified without thyrotoxic crisis or storm: Secondary | ICD-10-CM

## 2020-03-25 DIAGNOSIS — E0500 Thyrotoxicosis with diffuse goiter without thyrotoxic crisis or storm: Secondary | ICD-10-CM | POA: Diagnosis not present

## 2020-03-25 LAB — TSH: TSH: 0.83 u[IU]/mL (ref 0.35–5.50)

## 2020-03-25 LAB — T4, FREE: Free T4: 0.86 ng/dL (ref 0.60–1.60)

## 2020-03-25 NOTE — Patient Instructions (Signed)
-   Continue PTU 50 mg, 1 tablet daily for now

## 2020-03-25 NOTE — Progress Notes (Signed)
Name: Ruth Phillips  MRN/ DOB: [id number], [birth year]    Age/ Sex: 20 y.o., female     PCP: Nelwyn Salisbury, MD   Reason for Endocrinology Evaluation: Hyperthyroidism     Initial Endocrinology Clinic Visit: 10/13/2018    PATIENT IDENTIFIER: Ruth Phillips is a 20 y.o., female with unremarkable past medical history. She has followed with [facility] since 10/13/2018 for consultative assistance with management of her hyperthyroidism     HISTORICAL SUMMARY:  The patient was first diagnosed with hyperthyroidism Secondary to Graves' Disease in 09/2018  after she noted an enlarging thyroid gland a few months prior to her presentation.   A thyroid ultrasound on 09/25/2018 shows a heterogenous gland with a left inferior nodule , separate from the thyroid gland, a CT scan of the neck demonstrated a17 x 21 mm soft tissue nodule anterior mediastinum just above the left innominate vein and below the left lobe of the thyroid corresponds to the ultrasound abnormality. This was deemed to be a thymus gland.   Methimazole was started 09/2018 , switched to PTU in 08/2019 due to metalic taste Mother with Grave's disease S/P RAI , maternal Grandfather with hyperthyroidism    SUBJECTIVE:    Today (03/25/2020):  Ruth Phillips is here for a follow up on hyperthyroidism secondary to Graves' Disease. She has been taking 1 tablet of PTU.  She has been compliant with this.   Weight has decreased    Has night time palpitations  Denies vomiting and diarrhea Denies anxiety  Denies neck swelling.   PTU 50 mg daily   Studies political science graduating in 2022    HISTORY:  Past Medical History:  Past Medical History:  Diagnosis Date   Allergy    Headache(784.0)     Past Surgical History: No past surgical history on file.  Social History:  reports that she has never smoked. She has never used smokeless tobacco. She reports that she does not drink alcohol and does not use  drugs. Family History:  Family History  Problem Relation Age of Onset   Diabetes Mother    Thyroid disease Mother    Asthma Father      HOME MEDICATIONS: Allergies as of 03/25/2020   No Known Allergies     Medication List       Accurate as of March 25, 2020 10:33 AM. If you have any questions, ask your nurse or doctor.        propylthiouracil 50 MG tablet Commonly known as: PTU Take 1 tablet (50 mg total) by mouth daily.   XYZAL PO Take 1 tablet by mouth at bedtime.         OBJECTIVE:   PHYSICAL EXAM: VS: There were no vitals taken for this visit.   EXAM: General: Pt appears well and is in NAD  Neck: General: Supple without adenopathy. Thyroid: Thyroid size ~ 40 grams .  No nodules appreciated.   Lungs: Clear with good BS bilat with no rales, rhonchi, or wheezes  Heart: Auscultation: RRR.  Abdomen: Normoactive bowel sounds, soft, nontender, without masses or organomegaly palpable  Extremities:  BL LE: No pretibial edema normal ROM and strength.  Mental Status: Judgment, insight: Intact Orientation: Oriented to time, place, and person Mood and affect: No depression, anxiety, or agitation     DATA REVIEWED: Results for Ruth, Phillips (MRN [id number]) as of 03/28/2020 07:19  Ref. Range 03/25/2020 14:37  TSH Latest Ref Range: 0.35 - 5.50 uIU/mL 0.83  T4,Free(Direct) Latest Ref  Range: 0.60 - 1.60 ng/dL 3.29      Results for Ruth Phillips, Ruth Phillips (MRN [id number]) as of 12/08/2018 10:46  Ref. Range 10/10/2018 15:11  TRAB Latest Ref Range: <=2.00 IU/L 11.36 (H)    ASSESSMENT / PLAN / RECOMMENDATIONS:   1. Hyperthyroidism Secondary to Graves' Disease:   - Switched Methimazole to PTU due to metallic taste with Methimazole  - TFT's normal, no changes   Medications  Continue PTU 50 mg daily      2. Graves' Disease:  - No extra thyroidal manifestations of graves' disease.      F/U in 4 months    Signed electronically by: Lyndle Herrlich,  MD  [facility]  [facility] [address] [place] [place], [place] [address] Phone: [phone] FAX: [phone]      CC: Nelwyn Salisbury, MD [address] [place] [address] Phone: [phone]  Fax: [phone]   Return to Endocrinology clinic as below: Future Appointments  Date Time Provider Department Center  03/25/2020  2:00 PM Ash Mcelwain, Konrad Dolores, MD [facility] None

## 2020-04-27 ENCOUNTER — Telehealth: Payer: Self-pay | Admitting: Internal Medicine

## 2020-04-27 NOTE — Telephone Encounter (Signed)
I have called patient's mother. She stated that patient losing a lot weight in the past few weeks and is very concern.   On patient's appointment on 03/25/2020, she was 138 lbs. Then about 2 week after the appointment patient lost about 3 lbs. Patient called her mother and inform her that she lost more weight, she had to tighten her belt, her clothes are falling off. Patient's appetite is the still same.

## 2020-04-27 NOTE — Telephone Encounter (Signed)
Patient's mother Almedia Balls requests to be called at ph# [phone] re: Patient's weight loss

## 2020-04-27 NOTE — Telephone Encounter (Signed)
Spoken to patient's mother and notified Dr Harvel Ricks comments. Verbalized understanding.

## 2020-04-27 NOTE — Telephone Encounter (Signed)
Her thyroid is normal on the last visit, I'll be happy to order labs on her, but the pt needs to be checked by PCP to cover all basis.

## 2020-05-06 ENCOUNTER — Encounter: Payer: Self-pay | Admitting: Family Medicine

## 2020-05-06 ENCOUNTER — Ambulatory Visit: Payer: Federal, State, Local not specified - PPO | Admitting: Family Medicine

## 2020-05-06 ENCOUNTER — Other Ambulatory Visit: Payer: Self-pay

## 2020-05-06 VITALS — BP 108/76 | HR 93 | Temp 98.6°F | Ht 66.0 in | Wt 141.4 lb

## 2020-05-06 DIAGNOSIS — R634 Abnormal weight loss: Secondary | ICD-10-CM

## 2020-05-06 DIAGNOSIS — M25561 Pain in right knee: Secondary | ICD-10-CM | POA: Diagnosis not present

## 2020-05-06 DIAGNOSIS — K5900 Constipation, unspecified: Secondary | ICD-10-CM

## 2020-05-06 DIAGNOSIS — M7051 Other bursitis of knee, right knee: Secondary | ICD-10-CM | POA: Diagnosis not present

## 2020-05-06 DIAGNOSIS — M25361 Other instability, right knee: Secondary | ICD-10-CM | POA: Diagnosis not present

## 2020-05-06 DIAGNOSIS — S86899A Other injury of other muscle(s) and tendon(s) at lower leg level, unspecified leg, initial encounter: Secondary | ICD-10-CM

## 2020-05-06 NOTE — Patient Instructions (Addendum)
Bursitis  Bursitis is when the fluid-filled sac (bursa) that covers and protects a joint is swollen (inflamed). Bursitis is most common near joints such as the knees, elbows, hips, and shoulders. It can cause pain and stiffness. Follow these instructions at home: Medicines  Take over-the-counter and prescription medicines only as told by your doctor.  If you were prescribed an antibiotic medicine, take it as told by your doctor. Do not stop taking it even if you start to feel better. General instructions  Rest the affected area as told by your doctor. ? If you can, raise (elevate) the affected area above the level of your heart while you are sitting or lying down. ? Avoid doing things that make the pain worse.  Use a splint, brace, pad, or walking aid as told by your doctor.  If directed, put ice on the affected area: ? If you have a removable splint or brace, take it off as told by your doctor. ? Put ice in a plastic bag. ? Place a towel between your skin and the bag, or between the splint or brace and the bag. ? Leave the ice on for 20 minutes, 2-3 times a day.  Keep all follow-up visits as told by your doctor. This is important.   Preventing symptoms Do these things to help you not have symptoms again:  Wear knee pads if you kneel often.  Wear sturdy running or walking shoes that fit you well.  Take a lot of breaks during activities that involve doing the same movements again and again.  Before you do any activity that takes a lot of effort, get your body ready by stretching.  Stay at a healthy weight or lose weight if your doctor says you should. If you need help doing this, ask your doctor.  Exercise often. If you start any new physical activity, do it slowly. Contact a doctor if you:  Have a fever.  Have chills.  Have symptoms that do not get better with treatment or home care. Summary  Bursitis is when the fluid-filled sac (bursa) that covers and protects a joint  is swollen.  Rest the affected area as told by your doctor.  Avoid doing things that make the pain worse.  Put ice on the affected area as told by your doctor. This information is not intended to replace advice given to you by your health care provider. Make sure you discuss any questions you have with your health care provider. Document Revised: 09/09/2019 Document Reviewed: 09/09/2019 Elsevier Patient Education  2021 Elsevier Inc.  Quadriceps Strain Rehab Ask your health care provider which exercises are safe for you. Do exercises exactly as told by your health care provider and adjust them as directed. It is normal to feel mild stretching, pulling, tightness, or discomfort as you do these exercises. Stop right away if you feel sudden pain or your pain gets worse. Do not begin these exercises until told by your health care provider. Stretching and range-of-motion exercises These exercises warm up your muscles and joints and improve the movement and flexibility of your thigh. These exercises can also help to relieve stiffness or swelling. Heel slides 1. Lie on your back with both legs straight. If this causes back discomfort, bend the knee of your healthy leg so your foot is flat on the floor. 2. Slowly slide your left / right heel back toward your buttocks. Stop when you feel a gentle stretch in the front of your knee or thigh (quadriceps). 3.  Hold this position for __________ seconds. 4. Slowly slide your left / right heel back to the starting position. Repeat __________ times. Complete this exercise __________ times a day.   Quadriceps stretch, prone 1. Lie on your abdomen on a firm surface, such as a bed or padded floor (prone position). 2. Bend your left / right knee and hold your ankle. If you cannot reach your ankle or pant leg, loop a belt around your foot and grab the belt instead. 3. Gently pull your heel toward your buttocks. Your knee should not slide out to the side. You should  feel a stretch in the front of your thigh and knee (quadriceps). 4. Hold this position for __________ seconds. Repeat __________ times. Complete this exercise __________ times a day.   Strengthening exercises These exercises build strength and endurance in your thigh. Endurance is the ability to use your muscles for a long time, even after your muscles get tired. Straight leg raises, supine This exercise stretches the muscles in front of your thigh (quadriceps) and the muscles that move your hips (hip flexors). Quality counts! Watch for signs that the quadriceps muscle is working to ensure that you are strengthening the correct muscles and not cheating by using healthier muscles. 1. Lie on your back (supine position) with your left / right leg extended and your other knee bent. 2. Tense the muscles in the front of your left / right thigh. You should see your kneecap slide up or see increased dimpling just above the knee. 3. Tighten these muscles even more and raise your leg 4-6 inches (10-15 cm) off the floor. 4. Hold this position for __________ seconds. 5. Keep the thigh muscles tense as you lower your leg. 6. Relax the muscles slowly and completely after each repetition. Repeat __________ times. Complete this exercise __________ times a day. Leg raises, prone This exercise strengthens the muscles that move the hips (hip extensors). 1. Lie on your abdomen on a bed or a firm surface (prone position). Place a pillow under your hips. 2. Bend your left / right knee so your foot is straight up in the air. 3. Squeeze your buttocks muscles and lift your left / right thigh off the bed. Do not let your back arch. 4. Hold this position for __________ seconds. 5. Slowly return to the starting position. Let your muscles relax completely before doing another repetition. Repeat __________ times. Complete this exercise __________ times a day. Wall sits Follow the directions for form closely. Knee pain can  occur if your feet or knees are not placed properly. 1. Lean your back against a smooth wall or door, and walk your feet out 18-24 inches (46-61 cm) from it. 2. Place your feet hip-width apart. 3. Slowly slide down the wall or door until your knees bend __________ degrees. Keep your weight back and over your heels, not over your toes. Keep your thighs straight or pointing slightly outward. 4. Hold this position for __________ seconds. 5. Use your thigh and buttocks muscles to push yourself back up to a standing position. Keep your weight through your heels while you do this. 6. Rest for __________ seconds after each repetition. Repeat __________ times. Complete this exercise __________ times a day.   This information is not intended to replace advice given to you by your health care provider. Make sure you discuss any questions you have with your health care provider. Document Revised: 06/27/2018 Document Reviewed: 12/26/2017 Elsevier Patient Education  2021 Elsevier Inc.  High-Protein  and High-Calorie Diet Eating high-protein and high-calorie foods can help you to gain weight, heal after an injury, and recover after an illness or surgery. The specific amount of daily protein and calories you need depends on:  Your body weight.  The reason this diet is recommended for you. What is my plan? Generally, a high-protein, high-calorie diet involves:  Eating 250-500 extra calories each day.  Making sure that you get enough of your daily calories from protein. Ask your health care provider how many of your calories should come from protein. Talk with a health care provider, such as a diet and nutrition specialist (dietitian), about how much protein and how many calories you need each day. Follow the diet as directed by your health care provider. What are tips for following this plan? Preparing meals  Add whole milk, half-and-half, or heavy cream to cereal, pudding, soup, or hot cocoa.  Add  whole milk to instant breakfast drinks.  Add peanut butter to oatmeal or smoothies.  Add powdered milk to baked goods, smoothies, or milkshakes.  Add powdered milk, cream, or butter to mashed potatoes.  Add cheese to cooked vegetables.  Make whole-milk yogurt parfaits. Top them with granola, fruit, or nuts.  Add cottage cheese to your fruit.  Add avocado, cheese, or both to sandwiches or salads.  Add meat, poultry, or seafood to rice, pasta, casseroles, salads, and soups.  Use mayonnaise when making egg salad, chicken salad, or tuna salad.  Use peanut butter as a dip for vegetables or as a topping for pretzels, celery, or crackers.  Add beans to casseroles, dips, and spreads.  Add pureed beans to sauces and soups.  Replace calorie-free drinks with calorie-containing drinks, such as milk and fruit juice.  Replace water with milk or heavy cream when making foods such as oatmeal, pudding, or cocoa. General instructions  Ask your health care provider if you should take a nutritional supplement.  Try to eat six small meals each day instead of three large meals.  Eat a balanced diet. In each meal, include one food that is high in protein.  Keep nutritious snacks available, such as nuts, trail mixes, dried fruit, and yogurt.  If you have kidney disease or diabetes, talk with your health care provider about how much protein is safe for you. Too much protein may put extra stress on your kidneys.  Drink your calories. Choose high-calorie drinks and have them after your meals.   What high-protein foods should I eat? Vegetables Soybeans. Peas. Grains Quinoa. Bulgur wheat. Meats and other proteins Beef, pork, and poultry. Fish and seafood. Eggs. Tofu. Textured vegetable protein (TVP). Peanut butter. Nuts and seeds. Dried beans. Protein powders. Dairy Whole milk. Whole-milk yogurt. Powdered milk. Cheese. [employer]. Eggnog. Beverages High-protein supplement drinks. Soy  milk. Other foods Protein bars. The items listed above may not be a complete list of high-protein foods and beverages. Contact a dietitian for more options.   What high-calorie foods should I eat? Fruits Dried fruit. Fruit leather. Canned fruit in syrup. Fruit juice. Avocado. Vegetables Vegetables cooked in oil or butter. Fried potatoes. Grains Pasta. Quick breads. Muffins. Pancakes. Ready-to-eat cereal. Meats and other proteins Peanut butter. Nuts and seeds. Dairy Heavy cream. Whipped cream. Cream cheese. Sour cream. Ice cream. Custard. Pudding. Beverages Meal-replacement beverages. Nutrition shakes. Fruit juice. Sugar-sweetened soft drinks. Seasonings and condiments Salad dressing. Mayonnaise. Alfredo sauce. Fruit preserves or jelly. Honey. Syrup. Sweets and desserts Cake. Cookies. Pie. Pastries. Candy bars. Chocolate. Fats and oils Butter  or margarine. Oil. Gravy. Other foods Meal-replacement bars. The items listed above may not be a complete list of high-calorie foods and beverages. Contact a dietitian for more options. Summary  A high-protein, high-calorie diet can help you gain weight or heal faster after an injury, illness, or surgery.  To increase your protein and calories, add ingredients such as whole milk, peanut butter, cheese, beans, meat, or seafood to meal items.  To get enough extra calories each day, include high-calorie foods and beverages at each meal.  Adding a high-calorie drink or shake can be an easy way to help you get enough calories each day. Talk with your healthcare provider or dietitian about the best options for you. This information is not intended to replace advice given to you by your health care provider. Make sure you discuss any questions you have with your health care provider. Document Revised: 02/15/2017 Document Reviewed: 01/15/2017 Elsevier Patient Education  2021 Elsevier Inc.  Constipation, Adult Constipation is when a person has  trouble pooping (having a bowel movement). When you have this condition, you may poop fewer than 3 times a week. Your poop (stool) may also be dry, hard, or bigger than normal. Follow these instructions at home: Eating and drinking  Eat foods that have a lot of fiber, such as: ? Fresh fruits and vegetables. ? Whole grains. ? Beans.  Eat less of foods that are low in fiber and high in fat and sugar, such as: ? [place] fries. ? Hamburgers. ? Cookies. ? Candy. ? Soda.  Drink enough fluid to keep your pee (urine) pale yellow.   General instructions  Exercise regularly or as told by your doctor. Try to do 150 minutes of exercise each week.  Go to the restroom when you feel like you need to poop. Do not hold it in.  Take over-the-counter and prescription medicines only as told by your doctor. These include any fiber supplements.  When you poop: ? Do deep breathing while relaxing your lower belly (abdomen). ? Relax your pelvic floor. The pelvic floor is a group of muscles that support the rectum, bladder, and intestines (as well as the uterus in women).  Watch your condition for any changes. Tell your doctor if you notice any.  Keep all follow-up visits as told by your doctor. This is important. Contact a doctor if:  You have pain that gets worse.  You have a fever.  You have not pooped for 4 days.  You vomit.  You are not hungry.  You lose weight.  You are bleeding from the opening of the butt (anus).  You have thin, pencil-like poop. Get help right away if:  You have a fever, and your symptoms suddenly get worse.  You leak poop or have blood in your poop.  Your belly feels hard or bigger than normal (bloated).  You have very bad belly pain.  You feel dizzy or you faint. Summary  Constipation is when a person poops fewer than 3 times a week, has trouble pooping, or has poop that is dry, hard, or bigger than normal.  Eat foods that have a lot of fiber.  Drink  enough fluid to keep your pee (urine) pale yellow.  Take over-the-counter and prescription medicines only as told by your doctor. These include any fiber supplements. This information is not intended to replace advice given to you by your health care provider. Make sure you discuss any questions you have with your health care provider. Document Revised: 01/21/2019  Document Reviewed: 01/21/2019 Elsevier Patient Education  2021 [employer].

## 2020-05-06 NOTE — Progress Notes (Signed)
Subjective:    Patient ID: Ruth Phillips, female    DOB: Apr 22, 2000, 19 y.o.   MRN: [id number]  No chief complaint on file.   HPI  Pt is a 20 yo female with pmh sig for allergies, Grave's dz hyperthyroidism who is seen by Dr. Clent Ridges and presents for acute concerns.  Pt notes unintentional weight loss, ~10 lbs in 4 wks. Typically weighs around 140lbs  but has been around 130-135 lbs.  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  May eat oatmeal for breakfast, has lunch or late lunch, and tries to eat dinner at the cafe'.  It has a late lunch meal and snack for dinner which may be a granola bar.  Patient also endorses constipation.  Has a bowel movement daily but has to strain.  Drinking 1-2 bottles of water per day.  Also having right knee pain and tightness in shins.  Denies increased knee edema, erythema, walking, physical activity changes in shoes, or injury.  Past Medical History:  Diagnosis Date  . Allergy   . Headache(784.0)     No Known Allergies  ROS General: Denies fever, chills, night sweats, changes in appetite + changes in weight HEENT: Denies headaches, ear pain, changes in vision, rhinorrhea, sore throat CV: Denies CP, palpitations, SOB, orthopnea Pulm: Denies SOB, cough, wheezing GI: Denies abdominal pain, nausea, vomiting, diarrhea + constipation GU: Denies dysuria, hematuria, frequency, vaginal discharge Msk: Denies muscle cramps, joint pains + right knee pain, tightness in shins Neuro: Denies weakness, numbness, tingling Skin: Denies rashes, bruising Psych: Denies depression, anxiety, hallucinations     Objective:    Blood pressure 108/76, pulse 93, temperature 98.6 F (37 C), temperature source Oral, height 5\' 6"  (1.676 m), weight 141 lb 6.4 oz (64.1 kg), SpO2 98 %.  Gen. Pleasant, well-nourished, in no distress, normal affect   HEENT: [place]/AT, face symmetric, conjunctiva clear, no scleral icterus, PERRLA, EOMI, nares patent without drainage Lungs: no  accessory muscle use Cardiovascular: RRR, no peripheral edema Musculoskeletal: Mild edema of right knee at tibial tuberosity.  No crepitus of bilateral knees.  TTP over right knee lateral joint line and inferior patellar bursa.   Increased laxity of R patellofemoral tendon.  Negative anterior drawer.  No TTP of bilateral shins or left knee.  No deformities, no cyanosis or clubbing, normal tone Neuro:  A&Ox3, CN II-XII intact, normal gait Skin:  Warm, no lesions/ rash   Wt Readings from Last 3 Encounters:  03/25/20 138 lb 4 oz (62.7 kg)  11/13/19 147 lb (66.7 kg) (77 %, Z= 0.75)*  08/27/19 152 lb 9.6 oz (69.2 kg) (83 %, Z= 0.94)*   * Growth percentiles are based on CDC (Girls, 2-20 Years) data.    Lab Results  Component Value Date   WBC 7.0 08/27/2019   HGB 13.4 08/27/2019   HCT 38.5 08/27/2019   PLT 252.0 08/27/2019   GLUCOSE 81 11/13/2019   CHOL 137 08/27/2019   TRIG 62.0 08/27/2019   HDL 46.40 08/27/2019   LDLCALC 78 08/27/2019   ALT 12 11/13/2019   AST 18 11/13/2019   NA 141 11/13/2019   K 3.7 (L) 11/13/2019   CL 105 11/13/2019   CREATININE 0.76 11/13/2019   BUN 8 11/13/2019   CO2 26 11/13/2019   TSH 0.83 03/25/2020    Assessment/Plan:  Infrapatellar bursitis of right knee -Discussed supportive care including ice, NSAIDs or Tylenol, topical analgesics such as Aspercreme, stretching, compression -Given handout -Continue to monitor  Patellar instability of  right knee -2/2 increased laxity of patellofemoral tendon -Discussed exercises to strengthen quads -Consider knee brace -Discussed wearing supportive shoes -Continue to monitor  Acute pain of right knee -2/2 bursitis laxity of patella tendon -Supportive care as above  Weight loss - ~10 lb weight loss in 1 month -TSH 0.83 and free T4 0.6 on 03/25/2020 with endocrinology -Discussed eating regular meals and decreasing intake of fast food.  Also discussed healthy snacks. -Given handout -For continued weight  gain repeat thyroid labs and order additional studies -Continue to monitor  Constipation, unspecified constipation type -Discussed increasing p.o. intake of water and fiber -Patient encouraged to decrease intake of fast food/greasy foods -Continue to monitor  Anterior shin splints -Discussed stretching exercises -Given exercise bands  F/u as needed  Abbe Amsterdam, MD

## 2020-05-28 DIAGNOSIS — Z20828 Contact with and (suspected) exposure to other viral communicable diseases: Secondary | ICD-10-CM | POA: Diagnosis not present

## 2020-07-29 ENCOUNTER — Encounter: Payer: Self-pay | Admitting: Internal Medicine

## 2020-07-29 ENCOUNTER — Other Ambulatory Visit: Payer: Self-pay

## 2020-07-29 ENCOUNTER — Other Ambulatory Visit: Payer: Self-pay | Admitting: Internal Medicine

## 2020-07-29 ENCOUNTER — Ambulatory Visit: Payer: Federal, State, Local not specified - PPO | Admitting: Internal Medicine

## 2020-07-29 VITALS — BP 106/72 | HR 71 | Ht 66.0 in | Wt 148.0 lb

## 2020-07-29 DIAGNOSIS — E0590 Thyrotoxicosis, unspecified without thyrotoxic crisis or storm: Secondary | ICD-10-CM

## 2020-07-29 DIAGNOSIS — R7989 Other specified abnormal findings of blood chemistry: Secondary | ICD-10-CM | POA: Diagnosis not present

## 2020-07-29 DIAGNOSIS — E0500 Thyrotoxicosis with diffuse goiter without thyrotoxic crisis or storm: Secondary | ICD-10-CM

## 2020-07-29 LAB — COMPREHENSIVE METABOLIC PANEL
ALT: 92 U/L — ABNORMAL HIGH (ref 0–35)
AST: 57 U/L — ABNORMAL HIGH (ref 0–37)
Albumin: 4.2 g/dL (ref 3.5–5.2)
Alkaline Phosphatase: 56 U/L (ref 39–117)
BUN: 11 mg/dL (ref 6–23)
CO2: 28 mEq/L (ref 19–32)
Calcium: 9 mg/dL (ref 8.4–10.5)
Chloride: 104 mEq/L (ref 96–112)
Creatinine, Ser: 0.8 mg/dL (ref 0.40–1.20)
GFR: 106.02 mL/min (ref >60.00)
Glucose, Bld: 71 mg/dL (ref 70–99)
Potassium: 4.1 mEq/L (ref 3.5–5.1)
Sodium: 138 mEq/L (ref 135–145)
Total Bilirubin: 0.6 mg/dL (ref 0.2–1.2)
Total Protein: 7.3 g/dL (ref 6.0–8.3)

## 2020-07-29 LAB — TSH: TSH: 0.86 u[IU]/mL (ref 0.35–5.50)

## 2020-07-29 LAB — CBC WITH DIFFERENTIAL/PLATELET
Basophils Absolute: 0 10*3/uL (ref 0.0–0.1)
Basophils Relative: 0.6 % (ref 0.0–3.0)
Eosinophils Absolute: 0.2 10*3/uL (ref 0.0–0.7)
Eosinophils Relative: 4.9 % (ref 0.0–5.0)
HCT: 39.3 % (ref 36.0–46.0)
Hemoglobin: 13.5 g/dL (ref 12.0–15.0)
Lymphocytes Relative: 37.8 % (ref 12.0–46.0)
Lymphs Abs: 1.5 10*3/uL (ref 0.7–4.0)
MCHC: 34.3 g/dL (ref 30.0–36.0)
MCV: 88.5 fl (ref 78.0–100.0)
Monocytes Absolute: 0.4 10*3/uL (ref 0.1–1.0)
Monocytes Relative: 10.1 % (ref 3.0–12.0)
Neutro Abs: 1.8 10*3/uL (ref 1.4–7.7)
Neutrophils Relative %: 46.6 % (ref 43.0–77.0)
Platelets: 218 10*3/uL (ref 150.0–400.0)
RBC: 4.44 Mil/uL (ref 3.87–5.11)
RDW: 12.6 % (ref 11.5–14.6)
WBC: 3.9 10*3/uL — ABNORMAL LOW (ref 4.5–10.5)

## 2020-07-29 LAB — T4, FREE: Free T4: 0.71 ng/dL (ref 0.60–1.60)

## 2020-07-29 NOTE — Patient Instructions (Signed)
-   Continue PTU 50 mg, 1 tablet daily

## 2020-07-29 NOTE — Progress Notes (Signed)
Name: Ruth Phillips  MRN/ DOB: [id number], 02-Sep-2000    Age/ Sex: 20 y.o., female     PCP: Nelwyn Salisbury, MD   Reason for Endocrinology Evaluation: Hyperthyroidism     Initial Endocrinology Clinic Visit: 10/13/2018    PATIENT IDENTIFIER: Ruth Phillips is a 20 y.o., female with unremarkable past medical history. She has followed with [facility] since 10/13/2018 for consultative assistance with management of her hyperthyroidism     HISTORICAL SUMMARY:  The patient was first diagnosed with hyperthyroidism Secondary to Graves' Disease in 09/2018  after she noted an enlarging thyroid gland a few months prior to her presentation.   A thyroid ultrasound on 09/25/2018 shows a heterogenous gland with a left inferior nodule , separate from the thyroid gland, a CT scan of the neck demonstrated a17 x 21 mm soft tissue nodule anterior mediastinum just above the left innominate vein and below the left lobe of the thyroid corresponds to the ultrasound abnormality. This was deemed to be a thymus gland.   Methimazole was started 09/2018 , switched to PTU in 08/2019 due to metalic taste Mother with Grave's disease S/P RAI , maternal Grandfather with hyperthyroidism    SUBJECTIVE:    Today (07/29/2020):  Ruth Phillips is here for a follow up on hyperthyroidism secondary to Graves' Disease. She has been taking 1 tablet of PTU.  She has been compliant with this.   Weight has been fluctuating  Denies palpitations Denies diarrhea or loose stools  Denies vomiting Denies anxiety  Denies neck swelling.     PTU 50 mg daily   Studies political science graduating in 2024    HISTORY:  Past Medical History:  Past Medical History:  Diagnosis Date  . Allergy   . NTZGYFVC(944.9)     Past Surgical History: No past surgical history on file.  Social History:  reports that she has never smoked. She has never used smokeless tobacco. She reports that she does not drink alcohol and  does not use drugs. Family History:  Family History  Problem Relation Age of Onset  . Diabetes Mother   . Thyroid disease Mother   . Asthma Father      HOME MEDICATIONS: Allergies as of 07/29/2020   No Known Allergies     Medication List       Accurate as of Jul 29, 2020  2:28 PM. If you have any questions, ask your nurse or doctor.        propylthiouracil 50 MG tablet Commonly known as: PTU Take 1 tablet (50 mg total) by mouth daily.   XYZAL PO Take 1 tablet by mouth at bedtime.         OBJECTIVE:   PHYSICAL EXAM: VS: BP 106/72   Pulse 71   Ht 5\' 6"  (1.676 m)   Wt 148 lb (67.1 kg)   SpO2 99%   BMI 23.89 kg/m    EXAM: General: Pt appears well and is in NAD  Neck: General: Supple without adenopathy. Thyroid: Thyroid size ~ 40 grams .  No nodules appreciated.   Lungs: Clear with good BS bilat with no rales, rhonchi, or wheezes  Heart: Auscultation: RRR.  Abdomen: Normoactive bowel sounds, soft, nontender, without masses or organomegaly palpable  Extremities:  BL LE: No pretibial edema normal ROM and strength.  Mental Status: Judgment, insight: Intact Orientation: Oriented to time, place, and person Mood and affect: No depression, anxiety, or agitation     DATA REVIEWED: Results for (  MRN [id number]) as of 08/01/2020 13:44  Ref. Range 07/29/2020 14:26  Sodium Latest Ref Range: 135 - 145 mEq/L 138  Potassium Latest Ref Range: 3.5 - 5.1 mEq/L 4.1  Chloride Latest Ref Range: 96 - 112 mEq/L 104  CO2 Latest Ref Range: 19 - 32 mEq/L 28  Glucose Latest Ref Range: 70 - 99 mg/dL 71  BUN Latest Ref Range: 6 - 23 mg/dL 11  Creatinine Latest Ref Range: 0.40 - 1.20 mg/dL 5.46  Calcium Latest Ref Range: 8.4 - 10.5 mg/dL 9.0  Alkaline Phosphatase Latest Ref Range: 39 - 117 U/L 56  Albumin Latest Ref Range: 3.5 - 5.2 g/dL 4.2  AST Latest Ref Range: 0 - 37 U/L 57 (H)  ALT Latest Ref Range: 0 - 35 U/L 92 (H)  Total Protein Latest Ref Range: 6.0 - 8.3  g/dL 7.3  Total Bilirubin Latest Ref Range: 0.2 - 1.2 mg/dL 0.6  GFR Latest Ref Range: >60.00 mL/min 106.02  WBC Latest Ref Range: 4.5 - 10.5 K/uL 3.9 (L)  RBC Latest Ref Range: 3.87 - 5.11 Mil/uL 4.44  Hemoglobin Latest Ref Range: 12.0 - 15.0 g/dL 50.3  HCT Latest Ref Range: 36.0 - 46.0 % 39.3  MCV Latest Ref Range: 78.0 - 100.0 fl 88.5  MCHC Latest Ref Range: 30.0 - 36.0 g/dL 54.6  RDW Latest Ref Range: 11.5 - 14.6 % 12.6  Platelets Latest Ref Range: 150.0 - 400.0 K/uL 218.0  Neutrophils Latest Ref Range: 43.0 - 77.0 % 46.6  Lymphocytes Latest Ref Range: 12.0 - 46.0 % 37.8  Monocytes Relative Latest Ref Range: 3.0 - 12.0 % 10.1  Eosinophil Latest Ref Range: 0.0 - 5.0 % 4.9  Basophil Latest Ref Range: 0.0 - 3.0 % 0.6  NEUT# Latest Ref Range: 1.4 - 7.7 K/uL 1.8  Lymphocyte # Latest Ref Range: 0.7 - 4.0 K/uL 1.5  Monocyte # Latest Ref Range: 0.1 - 1.0 K/uL 0.4  Eosinophils Absolute Latest Ref Range: 0.0 - 0.7 K/uL 0.2  Basophils Absolute Latest Ref Range: 0.0 - 0.1 K/uL 0.0  TSH Latest Ref Range: 0.35 - 5.50 uIU/mL 0.86  T4,Free(Direct) Latest Ref Range: 0.60 - 1.60 ng/dL 5.68    Results for CADENCE, MINTON (MRN [id number]) as of 12/08/2018 10:46  Ref. Range 10/10/2018 15:11  TRAB Latest Ref Range: <=2.00 IU/L 11.36 (H)    ASSESSMENT / PLAN / RECOMMENDATIONS:   1. Hyperthyroidism Secondary to Graves' Disease:   - Switched Methimazole to PTU due to metallic taste with Methimazole  - TFT's normal - She has been on thionamide therapy for ~2 yrs without remission. We discussed RAI a - We extensively discussed the various treatment options for hyperthyroidism and Graves disease including ablation therapy with radioactive iodine versus antithyroid drug treatment versus surgical therapy.  We recommended to the patient that we felt, at this time, that she should consider RAI ablation or suggery  We discussed the various possible benefits versus side effects of the various therapies.   I  carefully explained to the patient that one of the consequences of I-131 ablation treatment would likely be permanent hypothyroidism which would require long-term replacement therapy with LT4.    Medications  Continue PTU 50 mg daily      2. Graves' Disease:  - No extra thyroidal manifestations of graves' disease.    3.  Elevated liver enzymes:  -Most likely related to PTU, this is not high enough to discontinue the PTU, but we did discuss alternatives such as radioactive iodine ablation versus total  thyroidectomy. -In the meantime we will continue to monitor LFTs, we will have to stop PTU if these levels range 3 times upper limit of normal  F/U in 6 months    Signed electronically by: Lyndle Herrlich, MD  [facility] Endocrinology  [facility] [address] [place] [address] [place], [place] [address] Phone: [phone] FAX: [phone]      CC: Nelwyn Salisbury, MD [address] [place] [address] Phone: [phone]  Fax: [phone]   Return to Endocrinology clinic as below: Future Appointments  Date Time Provider Department Center  01/27/2021  2:00 PM Suda Forbess, Konrad Dolores, MD [id number] None

## 2020-08-01 NOTE — Telephone Encounter (Signed)
Please advise 

## 2020-08-31 ENCOUNTER — Other Ambulatory Visit: Payer: Self-pay

## 2020-09-01 ENCOUNTER — Ambulatory Visit: Payer: Federal, State, Local not specified - PPO | Admitting: Family Medicine

## 2021-01-27 ENCOUNTER — Ambulatory Visit: Payer: Federal, State, Local not specified - PPO | Admitting: Internal Medicine

## 2021-01-27 ENCOUNTER — Encounter: Payer: Self-pay | Admitting: Internal Medicine

## 2021-01-27 ENCOUNTER — Other Ambulatory Visit: Payer: Self-pay

## 2021-01-27 VITALS — BP 110/72 | HR 68 | Ht 66.0 in | Wt 147.0 lb

## 2021-01-27 DIAGNOSIS — E0590 Thyrotoxicosis, unspecified without thyrotoxic crisis or storm: Secondary | ICD-10-CM

## 2021-01-27 DIAGNOSIS — E0500 Thyrotoxicosis with diffuse goiter without thyrotoxic crisis or storm: Secondary | ICD-10-CM | POA: Diagnosis not present

## 2021-01-27 DIAGNOSIS — R7989 Other specified abnormal findings of blood chemistry: Secondary | ICD-10-CM | POA: Diagnosis not present

## 2021-01-27 DIAGNOSIS — K625 Hemorrhage of anus and rectum: Secondary | ICD-10-CM

## 2021-01-27 LAB — CBC WITH DIFFERENTIAL/PLATELET
Basophils Absolute: 0 10*3/uL (ref 0.0–0.1)
Basophils Relative: 0.6 % (ref 0.0–3.0)
Eosinophils Absolute: 0.3 10*3/uL (ref 0.0–0.7)
Eosinophils Relative: 5.9 % — ABNORMAL HIGH (ref 0.0–5.0)
HCT: 40.7 % (ref 36.0–46.0)
Hemoglobin: 13.8 g/dL (ref 12.0–15.0)
Lymphocytes Relative: 34.2 % (ref 12.0–46.0)
Lymphs Abs: 1.5 10*3/uL (ref 0.7–4.0)
MCHC: 33.8 g/dL (ref 30.0–36.0)
MCV: 90.5 fl (ref 78.0–100.0)
Monocytes Absolute: 0.5 10*3/uL (ref 0.1–1.0)
Monocytes Relative: 11.1 % (ref 3.0–12.0)
Neutro Abs: 2.2 10*3/uL (ref 1.4–7.7)
Neutrophils Relative %: 48.2 % (ref 43.0–77.0)
Platelets: 207 10*3/uL (ref 150.0–400.0)
RBC: 4.5 Mil/uL (ref 3.87–5.11)
RDW: 13.2 % (ref 11.5–14.6)
WBC: 4.5 10*3/uL (ref 4.5–10.5)

## 2021-01-27 LAB — COMPREHENSIVE METABOLIC PANEL
ALT: 126 U/L — ABNORMAL HIGH (ref 0–35)
AST: 99 U/L — ABNORMAL HIGH (ref 0–37)
Albumin: 4.2 g/dL (ref 3.5–5.2)
Alkaline Phosphatase: 61 U/L (ref 39–117)
BUN: 12 mg/dL (ref 6–23)
CO2: 29 mEq/L (ref 19–32)
Calcium: 9.3 mg/dL (ref 8.4–10.5)
Chloride: 105 mEq/L (ref 96–112)
Creatinine, Ser: 0.98 mg/dL (ref 0.40–1.20)
GFR: 82.81 mL/min (ref >60.00)
Glucose, Bld: 77 mg/dL (ref 70–99)
Potassium: 4.2 mEq/L (ref 3.5–5.1)
Sodium: 138 mEq/L (ref 135–145)
Total Bilirubin: 0.6 mg/dL (ref 0.2–1.2)
Total Protein: 7.3 g/dL (ref 6.0–8.3)

## 2021-01-27 LAB — GAMMA GT: GGT: 63 U/L — ABNORMAL HIGH (ref 7–51)

## 2021-01-27 LAB — T4, FREE: Free T4: 0.75 ng/dL (ref 0.60–1.60)

## 2021-01-27 LAB — TSH: TSH: 0.7 u[IU]/mL (ref 0.35–5.50)

## 2021-01-27 NOTE — Progress Notes (Signed)
Name: Ruth Phillips  MRN/ DOB: [id number], 04/27/2000    Age/ Sex: 20 y.o., female     PCP: Nelwyn Salisbury, MD   Reason for Endocrinology Evaluation: Hyperthyroidism     Initial Endocrinology Clinic Visit: 10/13/2018    PATIENT IDENTIFIER: [name] is a 20 y.o., female with unremarkable past medical history. She has followed with [facility] since 10/13/2018 for consultative assistance with management of her hyperthyroidism     HISTORICAL SUMMARY:  The patient was first diagnosed with hyperthyroidism Secondary to Graves' Disease in 09/2018  after she noted an enlarging thyroid gland a few months prior to her presentation.   A thyroid ultrasound on 09/25/2018 shows a heterogenous gland with a left inferior nodule , separate from the thyroid gland, a CT scan of the neck demonstrated a17 x 21 mm soft tissue nodule anterior mediastinum just above the left innominate vein and below the left lobe of the thyroid corresponds to the ultrasound abnormality. This was deemed to be a thymus gland.    Methimazole was started 09/2018 , switched to PTU in 08/2019 due to metalic taste Mother with Grave's disease S/P RAI , maternal Grandfather with hyperthyroidism    SUBJECTIVE:    Today (01/27/2021):  Ruth Phillips is here for a follow up on hyperthyroidism secondary to Graves' Disease. She is accompanied by mother today .   Weight has been stable   Denies palpitations Has chronic loose stools , noted blood in stools 2 months ago intermittent   Denies dizziness  Denies nausea or vomiting   Denies neck swelling.     PTU 50 mg daily   Studies political science graduating in 2024    HISTORY:  Past Medical History:  Past Medical History:  Diagnosis Date   Allergy    Headache(784.0)    Past Surgical History: No past surgical history on file. Social History:  reports that she has never smoked. She has never used smokeless tobacco. She reports that she does not  drink alcohol and does not use drugs. Family History:  Family History  Problem Relation Age of Onset   Diabetes Mother    Thyroid disease Mother    Asthma Father      HOME MEDICATIONS: Allergies as of 01/27/2021   No Known Allergies      Medication List        Accurate as of January 27, 2021  2:26 PM. If you have any questions, ask your nurse or doctor.          propylthiouracil 50 MG tablet Commonly known as: PTU TAKE 1 TABLET(50 MG) BY MOUTH DAILY   XYZAL PO Take 1 tablet by mouth at bedtime.          OBJECTIVE:   PHYSICAL EXAM: VS: BP 110/72 (BP Location: Left Arm, Patient Position: Sitting, Cuff Size: Small)   Pulse 68   Ht 5\' 6"  (1.676 m)   Wt 147 lb (66.7 kg)   SpO2 99%   BMI 23.73 kg/m    EXAM: General: Pt appears well and is in NAD  Neck: General: Supple without adenopathy. Thyroid: Thyroid size ~ 40 grams .  No nodules appreciated.   Lungs: Clear with good BS bilat with no rales, rhonchi, or wheezes  Heart: Auscultation: RRR.  Abdomen: Normoactive bowel sounds, soft, nontender, without masses or organomegaly palpable  Extremities:  BL LE: No pretibial edema normal ROM and strength.  Mental Status: Judgment, insight: Intact Orientation: Oriented to time, place, and person  Mood and affect: No depression, anxiety, or agitation     DATA REVIEWED: Results for KIANNAH, GRUNOW (MRN [id number]) as of 01/30/2021 10:53  Ref. Range 01/27/2021 14:36  Sodium Latest Ref Range: 135 - 145 mEq/L 138  Potassium Latest Ref Range: 3.5 - 5.1 mEq/L 4.2  Chloride Latest Ref Range: 96 - 112 mEq/L 105  CO2 Latest Ref Range: 19 - 32 mEq/L 29  Glucose Latest Ref Range: 70 - 99 mg/dL 77  BUN Latest Ref Range: 6 - 23 mg/dL 12  Creatinine Latest Ref Range: 0.40 - 1.20 mg/dL 6.07  Calcium Latest Ref Range: 8.4 - 10.5 mg/dL 9.3  Alkaline Phosphatase Latest Ref Range: 39 - 117 U/L 61  Albumin Latest Ref Range: 3.5 - 5.2 g/dL 4.2  AST Latest Ref Range: 0 - 37 U/L  99 (H)  ALT Latest Ref Range: 0 - 35 U/L 126 (H)  Total Protein Latest Ref Range: 6.0 - 8.3 g/dL 7.3  Total Bilirubin Latest Ref Range: 0.2 - 1.2 mg/dL 0.6  GGT Latest Ref Range: 7 - 51 U/L 63 (H)  GFR Latest Ref Range: >60.00 mL/min 82.81  WBC Latest Ref Range: 4.5 - 10.5 K/uL 4.5  RBC Latest Ref Range: 3.87 - 5.11 Mil/uL 4.50  Hemoglobin Latest Ref Range: 12.0 - 15.0 g/dL 37.1  HCT Latest Ref Range: 36.0 - 46.0 % 40.7  MCV Latest Ref Range: 78.0 - 100.0 fl 90.5  MCHC Latest Ref Range: 30.0 - 36.0 g/dL 06.2  RDW Latest Ref Range: 11.5 - 14.6 % 13.2  Platelets Latest Ref Range: 150.0 - 400.0 K/uL 207.0  Neutrophils Latest Ref Range: 43.0 - 77.0 % 48.2  Lymphocytes Latest Ref Range: 12.0 - 46.0 % 34.2  Monocytes Relative Latest Ref Range: 3.0 - 12.0 % 11.1  Eosinophil Latest Ref Range: 0.0 - 5.0 % 5.9 (H)  Basophil Latest Ref Range: 0.0 - 3.0 % 0.6  NEUT# Latest Ref Range: 1.4 - 7.7 K/uL 2.2  Lymphocyte # Latest Ref Range: 0.7 - 4.0 K/uL 1.5  Monocyte # Latest Ref Range: 0.1 - 1.0 K/uL 0.5  Eosinophils Absolute Latest Ref Range: 0.0 - 0.7 K/uL 0.3  Basophils Absolute Latest Ref Range: 0.0 - 0.1 K/uL 0.0  TSH Latest Ref Range: 0.35 - 5.50 uIU/mL 0.70  T4,Free(Direct) Latest Ref Range: 0.60 - 1.60 ng/dL 6.94     Results for YU, CRAGUN (MRN [id number]) as of 12/08/2018 10:46  Ref. Range 10/10/2018 15:11  TRAB Latest Ref Range: <=2.00 IU/L 11.36 (H)    ASSESSMENT / PLAN / RECOMMENDATIONS:   Hyperthyroidism Secondary to Graves' Disease:   - Switched Methimazole to PTU due to metallic taste with Methimazole  - TFT's normal - She has been on thionamide therapy for 2 yrs without remission. We discussed RAI  - We extensively discussed the various treatment options for hyperthyroidism and Graves disease including ablation therapy with radioactive iodine versus antithyroid drug treatment versus surgical therapy.  We recommended to the patient that we felt, at this time, that she  should consider RAI ablation or suggery  We discussed the various possible benefits versus side effects of the various therapies.   I carefully explained to the patient that one of the consequences of I-131 ablation treatment would likely be permanent hypothyroidism which would require long-term replacement therapy with LT4.    Medications  Continue PTU 50 mg daily for now      2. Graves' Disease:  - No extra thyroidal manifestations of graves' disease.    3.  Elevated liver enzymes:  -Most likely related to PTU, this is not high enough to discontinue the PTU, but we did discuss alternatives such as radioactive iodine ablation versus total thyroidectomy. -I have urged the pt to make a decision with alternative therapy   F/U in 6 months    Signed electronically by: Lyndle Herrlich, MD  [facility] Endocrinology  [facility] [address] [place] [address] [place], [place] [address] Phone: [phone] FAX: [phone]      CC: Nelwyn Salisbury, MD [address] [place] [address] Phone: [phone]  Fax: [phone]   Return to Endocrinology clinic as below: No future appointments.

## 2021-02-03 ENCOUNTER — Ambulatory Visit: Payer: Federal, State, Local not specified - PPO | Admitting: Internal Medicine

## 2021-03-15 ENCOUNTER — Encounter: Payer: Self-pay | Admitting: Internal Medicine

## 2021-03-15 DIAGNOSIS — E0500 Thyrotoxicosis with diffuse goiter without thyrotoxic crisis or storm: Secondary | ICD-10-CM

## 2021-03-15 DIAGNOSIS — E0590 Thyrotoxicosis, unspecified without thyrotoxic crisis or storm: Secondary | ICD-10-CM

## 2021-04-03 MED ORDER — METHIMAZOLE 5 MG PO TABS: 5.0000 mg | ORAL_TABLET | Freq: Every day | ORAL | 1 refills | Status: DC

## 2021-05-01 IMAGING — CT CT NECK WITH CONTRAST
4 series · 15 of 33 positions shown, 18 images · IV contrast (OMNIPAQUE 300)
Comparison: Thyroid ultrasound 09/25/2018

CLINICAL DATA: Neck mass identified on thyroid ultrasound.

EXAM:
CT NECK WITH CONTRAST
TECHNIQUE: Multidetector CT imaging of the neck was performed using the
standard protocol following the bolus administration of intravenous
contrast.
CONTRAST:  75mL OMNIPAQUE IOHEXOL 300 MG/ML  SOLN

[Series 3: neck 2.0 i31s 2 · axial · 0.50mm/px · z∈[-238,-66]mm · 5 of 130 slices shown, 7 images]
[im 22/130  soft-tissue]
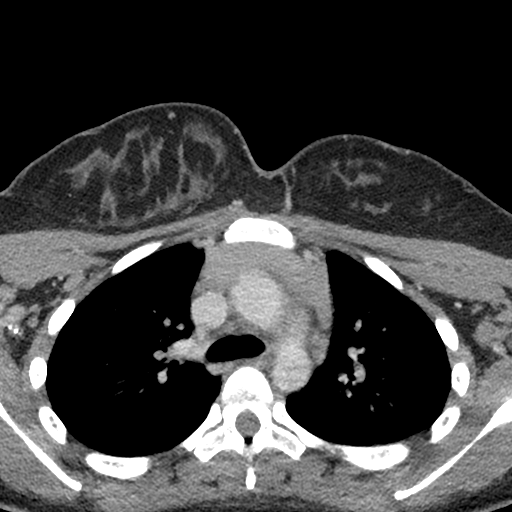
[im 22/130  bone]
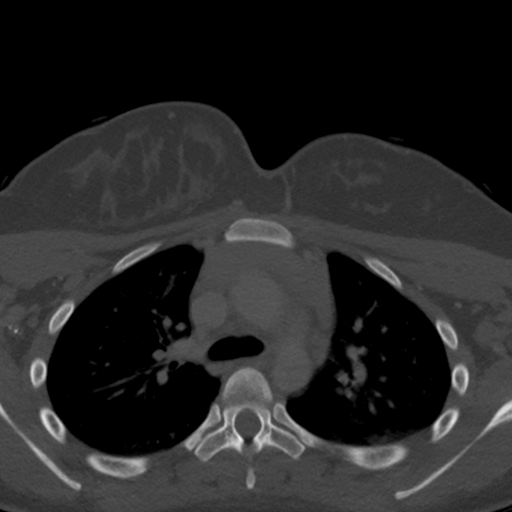
[im 44/130  bone]
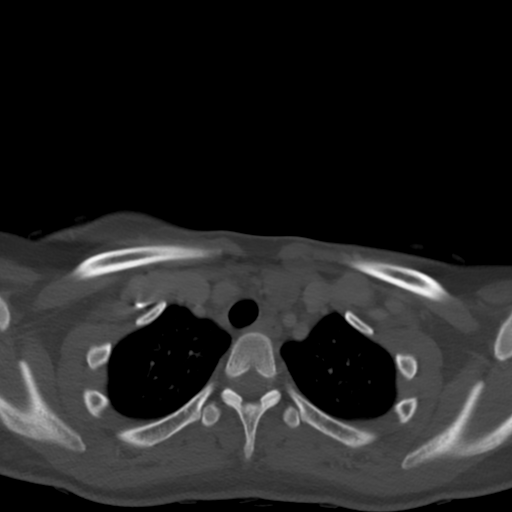
[im 65/130  bone]
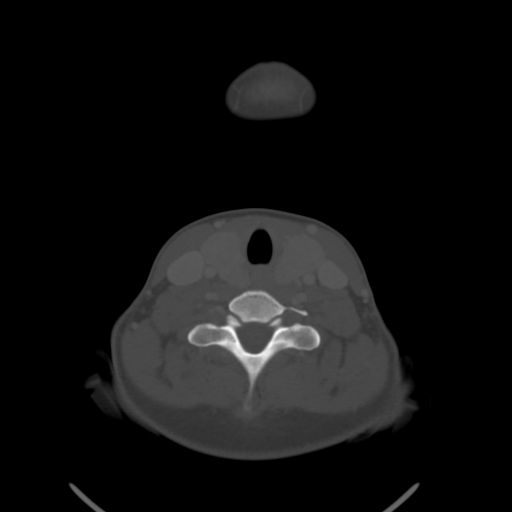
[im 87/130  bone]
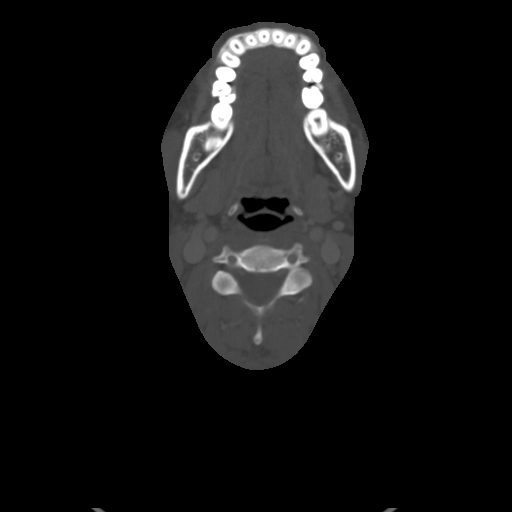
[im 108/130  soft-tissue]
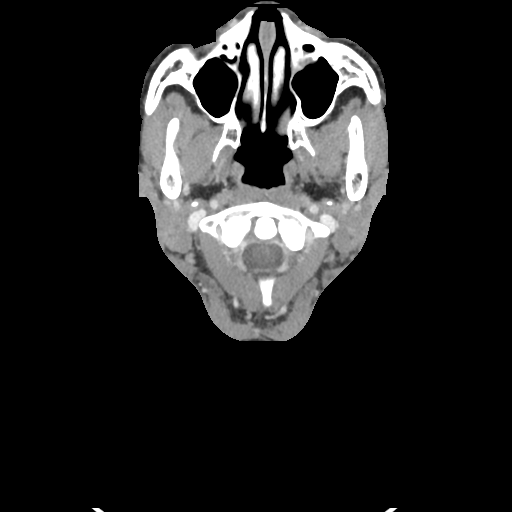
[im 108/130  bone]
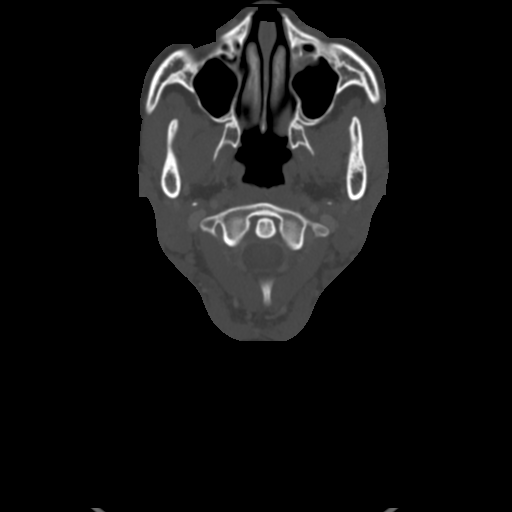

[Series 6: coronal st · coronal · 0.51mm/px · 3 of 116 slices shown]
[im 24/116  bone]
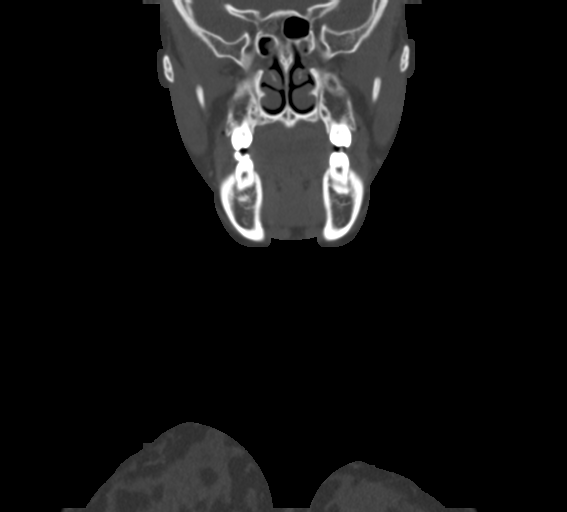
[im 47/116  bone]
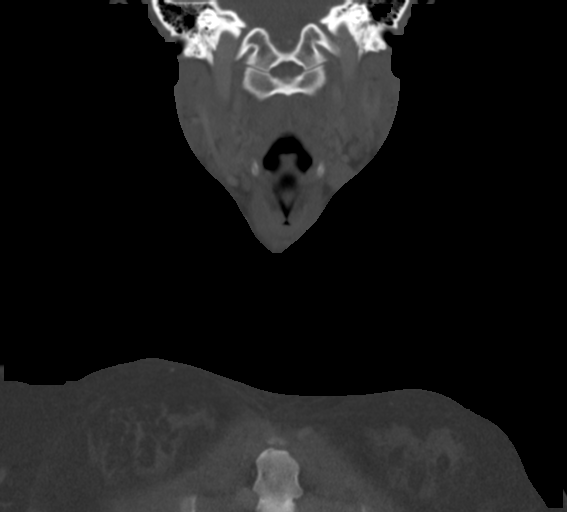
[im 70/116  bone]
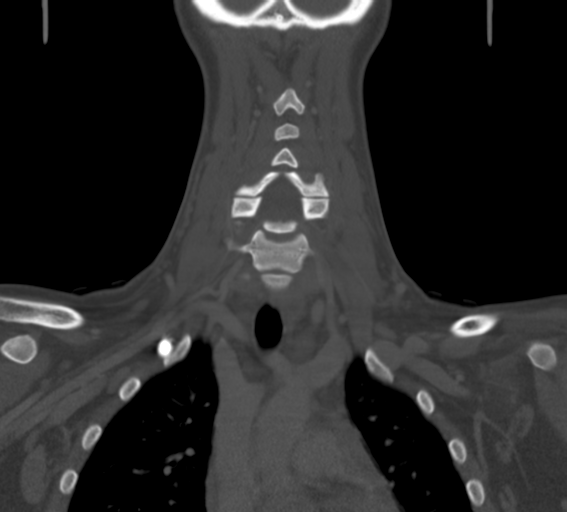

[Series 7: sagittal st · sagittal · 0.51mm/px · 5 of 101 slices shown, 6 images]
[im 34/101  bone]
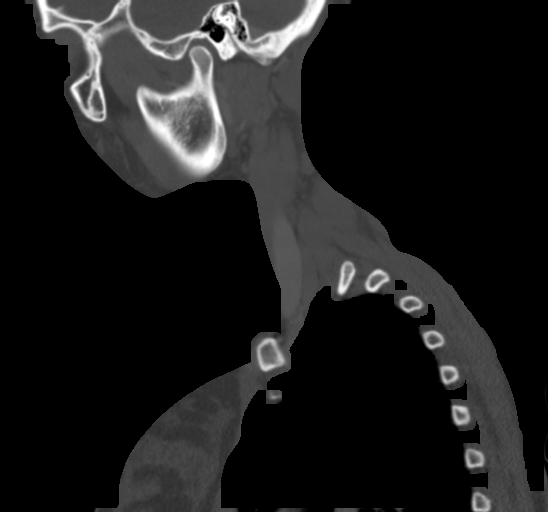
[im 42/101  bone]
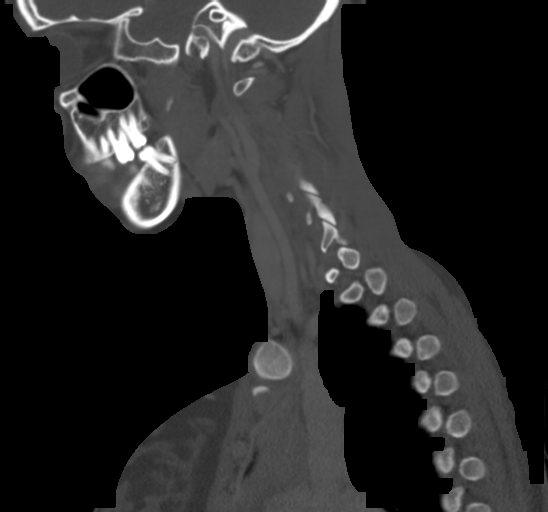
[im 51/101  soft-tissue]
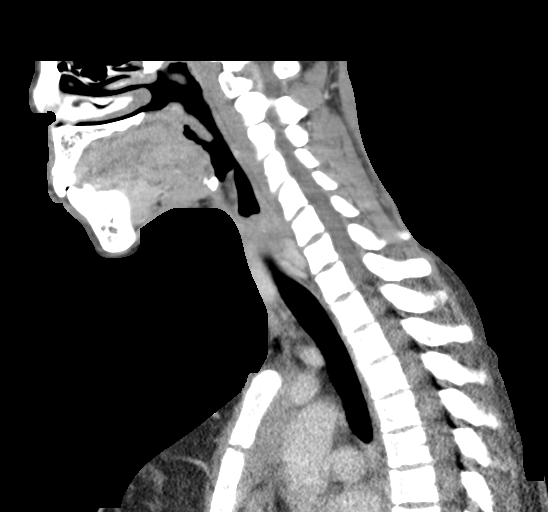
[im 51/101  bone]
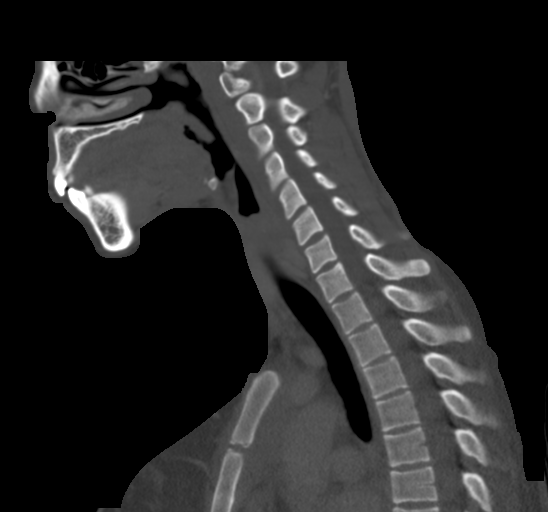
[im 59/101  bone]
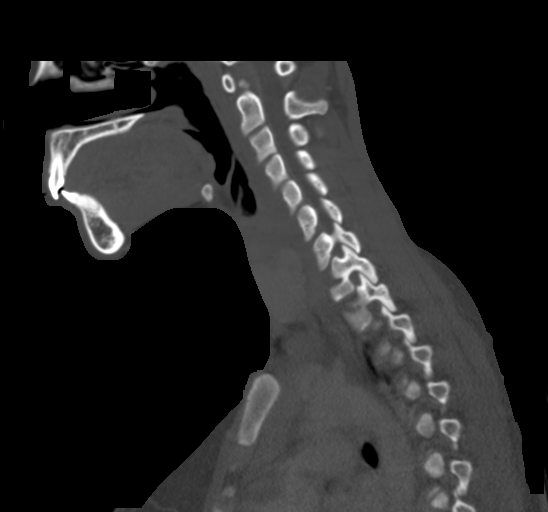
[im 67/101  bone]
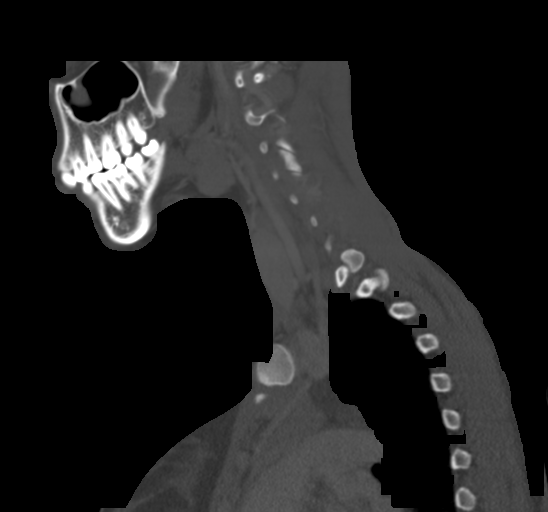

[Series 8: orthogonal · axial · 0.39mm/px · z∈[-268,-226]mm · 2 of 135 slices shown]
[im 23/135  bone]
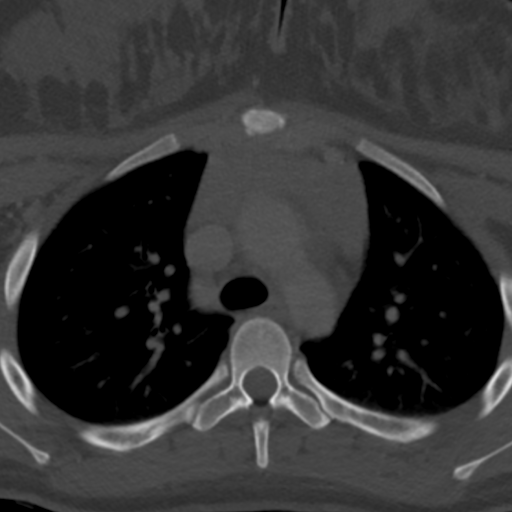
[im 45/135  bone]
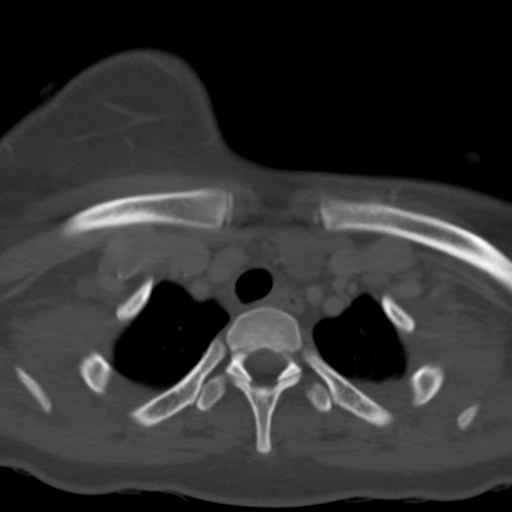

[15 of 33 positions shown; findings below may reference images not displayed]

FINDINGS: Pharynx and larynx: Normal. No mass or swelling.

Salivary glands: No inflammation, mass, or stone.

Thyroid: Mild thyroid enlargement bilaterally. No focal thyroid
nodule.

Soft tissue nodule below the left lobe of the thyroid is identified
by CT. This is located in the anterior mediastinum posterior to the
head of the clavicle on the left. This is just above the left
innominate vein. This has homogeneous soft tissue density and
measures approximately 17 x 21 mm.

Lymph nodes: 17 x 21 mm nodule anterior mediastinum just above the
left innominate vein may represent enlarged lymph node. No other
enlarged lymph nodes in the neck. Scattered small lymph nodes are
present bilaterally which appear to be reactive nodes.

Vascular: Normal vascular enhancement

Limited intracranial: Negative

Visualized orbits: Negative

Mastoids and visualized paranasal sinuses: Mild mucosal edema
paranasal sinuses.

Skeleton: Negative

Upper chest: Lung apices clear bilaterally.

Other: None
IMPRESSION: 1. 17 x 21 mm soft tissue nodule anterior mediastinum just above the
left innominate vein and below the left lobe of the thyroid
corresponds to the ultrasound abnormality. Probable enlarged lymph
node. Not a typical location for parathyroid adenoma. The nodule
appears extra thyroid. No other enlarged lymph nodes in the neck.
This is likely difficult to palpate as it is posterior to the head
of the clavicle on the left. Consider ultrasound guided FNA.
2. Mild bilateral thyromegaly. Correlate with thyroid function test.

## 2021-05-17 ENCOUNTER — Encounter: Payer: Self-pay | Admitting: Gastroenterology

## 2021-05-23 ENCOUNTER — Ambulatory Visit: Payer: Federal, State, Local not specified - PPO | Admitting: Internal Medicine

## 2021-06-01 ENCOUNTER — Other Ambulatory Visit (INDEPENDENT_AMBULATORY_CARE_PROVIDER_SITE_OTHER): Payer: Federal, State, Local not specified - PPO

## 2021-06-01 DIAGNOSIS — E0590 Thyrotoxicosis, unspecified without thyrotoxic crisis or storm: Secondary | ICD-10-CM

## 2021-06-01 LAB — TSH: TSH: 0.85 u[IU]/mL (ref 0.35–5.50)

## 2021-06-01 LAB — T4, FREE: Free T4: 0.94 ng/dL (ref 0.60–1.60)

## 2021-06-30 ENCOUNTER — Ambulatory Visit: Payer: Federal, State, Local not specified - PPO | Admitting: Gastroenterology

## 2021-06-30 ENCOUNTER — Encounter: Payer: Self-pay | Admitting: Gastroenterology

## 2021-06-30 VITALS — BP 108/56 | HR 63 | Ht 64.0 in | Wt 143.8 lb

## 2021-06-30 DIAGNOSIS — K625 Hemorrhage of anus and rectum: Secondary | ICD-10-CM | POA: Diagnosis not present

## 2021-06-30 DIAGNOSIS — Z124 Encounter for screening for malignant neoplasm of cervix: Secondary | ICD-10-CM | POA: Diagnosis not present

## 2021-06-30 DIAGNOSIS — B009 Herpesviral infection, unspecified: Secondary | ICD-10-CM | POA: Insufficient documentation

## 2021-06-30 DIAGNOSIS — Z113 Encounter for screening for infections with a predominantly sexual mode of transmission: Secondary | ICD-10-CM | POA: Diagnosis not present

## 2021-06-30 DIAGNOSIS — Z01419 Encounter for gynecological examination (general) (routine) without abnormal findings: Secondary | ICD-10-CM | POA: Diagnosis not present

## 2021-06-30 DIAGNOSIS — Z6824 Body mass index (BMI) 24.0-24.9, adult: Secondary | ICD-10-CM | POA: Diagnosis not present

## 2021-06-30 DIAGNOSIS — R194 Change in bowel habit: Secondary | ICD-10-CM | POA: Diagnosis not present

## 2021-06-30 DIAGNOSIS — B001 Herpesviral vesicular dermatitis: Secondary | ICD-10-CM | POA: Diagnosis not present

## 2021-06-30 DIAGNOSIS — K6289 Other specified diseases of anus and rectum: Secondary | ICD-10-CM

## 2021-06-30 MED ORDER — NA SULFATE-K SULFATE-MG SULF 17.5-3.13-1.6 GM/177ML PO SOLN: 1.0000 | Freq: Once | ORAL | 0 refills | Status: AC

## 2021-06-30 NOTE — Progress Notes (Signed)
? ?Referring Provider: Lonzo Cloud, Clemmie Krill* ?Primary Care Physician:  Nelwyn Salisbury, MD ? ? ?Reason for Consultation:  Rectal pain and change in bowel habits ? ? ?IMPRESSION:  ?Altered bowel habits x 6 months with suspected pelvic floor dysfunction ?Blood in the stool not explained by rectal exam ?Rectal pain and irritation ?Perianal dermatitis ?Grave's disease ? ? ?PLAN: ?- Recticare for local therapy ?- Colonoscopy to evaluate for IBD given concurrent autoimmune disease ?- Add a daily dose of Metamucil or Benefiber ?- Work to minimize straining ?- Squatty potty recommended ?- Anorectal manometry after colonoscopy ? ? ?HPI: Ruth Phillips is a 21 y.o. female referred by Dr. Lonzo Cloud for further evaluation of altered bowel habits and blood in the stool.  The history is obtained through the patient and review of her electronic health record. 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  She is hoping to go to law school in the [place] area. ? ?She has hyperthyroidism secondary to Grave's disease.  On PTU because she is intolerant to methimazole.  She has had loose stools for over 6 months and intermittent rectal bleeding over the last 2 months.  Thought diarrhea might be related to methimazole but symptoms have not improved on PTU.  ? ?Now with constipation and rectal bleeding.  Associated bloating.  Has a bowel movement every morning and evening with associated straining, incomplete evacuation, and bloating. Rectal pain with defecation.  Frequent rectal itching.  Does not occur with every bowel movement. No abdominal pain. No diarrhea.  Weight is stable.  Appetite is good. ? ?Mother with Graves' disease, maternal grandfather with hyperthyroidism.  No other known family history of autoimmune disease.  There is no known family history of colon cancer or polyps. No family history of stomach cancer or other GI malignancy. No family history of  inflammatory bowel disease or celiac.  ? ?No prior endoscopic evaluation. ? ?No prior abdominal imaging. ? ?She has not tried any OTC medications or prescription medications to improve her symptoms.  ? ? ?Past Medical History:  ?Diagnosis Date  ? Allergy   ? Headache(784.0)   ? ? ?History reviewed. No pertinent surgical history. ? ? ?Current Outpatient Medications  ?Medication Sig Dispense Refill  ? Levocetirizine Dihydrochloride (XYZAL PO) Take 1 tablet by mouth at bedtime.    ? methimazole (TAPAZOLE) 5 MG tablet Take 1 tablet (5 mg total) by mouth daily. 90 tablet 1  ? ?No current facility-administered medications for this visit.  ? ? ?Allergies as of 06/30/2021  ? (No Known Allergies)  ? ? ?Family History  ?Problem Relation Age of Onset  ? Diabetes Mother   ? Thyroid disease Mother   ? Asthma Father   ? ? ?Social History  ? ?Socioeconomic History  ? Marital status: Single  ?  Spouse name: Not on file  ? Number of children: Not on file  ? Years of education: Not on file  ? Highest education level: Not on file  ?Occupational History  ? Not on file  ?Tobacco Use  ? Smoking status: Never  ? Smokeless tobacco: Never  ?Substance and Sexual Activity  ? Alcohol use: No  ?  Alcohol/week: 0.0 standard drinks  ? Drug use: No  ? Sexual activity: Never  ?Other Topics Concern  ? Not on file  ?Social History Narrative  ? Not on file  ? ?Social Determinants of Health  ? ?Financial Resource Strain: Not on  file  ?Food Insecurity: Not on file  ?Transportation Needs: Not on file  ?Physical Activity: Not on file  ?Stress: Not on file  ?Social Connections: Not on file  ?Intimate Partner Violence: Not on file  ? ? ?Review of Systems: ?12 system ROS is negative except as noted above except for allergies.  ? ?Physical Exam: ?General:   Alert,  well-nourished, pleasant and cooperative in NAD ?Head:  Normocephalic and atraumatic. ?Eyes:  Sclera clear, no icterus.   Conjunctiva pink. ?Ears:  Normal auditory acuity. ?Nose:  No deformity,  discharge,  or lesions. ?Mouth:  No deformity or lesions.   ?Neck:  Supple; no masses or thyromegaly. ?Lungs:  Clear throughout to auscultation.   No wheezes. ?Heart:  Regular rate and rhythm; no murmurs. ?Abdomen:  Soft, nontender, nondistended, normal bowel sounds, no rebound or guarding. No hepatosplenomegaly.   ?Rectal:  Perianal chemical dermatitis with associated altered pigmentation. No external hemorrhoids, fissure or fistula. No prolapsing hemorrhoids. No rectal prolapse. Normal anocutaneous reflex. No stool in the rectal vault. No mass or fecal impaction. Normal anal resting tone.  Chaperone: [name] ?Msk:  Symmetrical. No boney deformities ?LAD: No inguinal or umbilical LAD ?Extremities:  No clubbing or edema. ?Neurologic:  Alert and  oriented x4;  grossly nonfocal ?Skin:  Intact without significant lesions or rashes. ?Psych:  Alert and cooperative. Normal mood and affect. ? ? ? ?Vianey Caniglia L. Orvan Falconer, MD, MPH ?06/30/2021, 11:20 AM ? ? ? ?  ?

## 2021-06-30 NOTE — Progress Notes (Signed)
See photo

## 2021-06-30 NOTE — Patient Instructions (Signed)
It was my pleasure to provide care to you today. Based on our discussion, I am providing you with my recommendations below: ? ?RECOMMENDATION(S):  ? ?COLONOSCOPY:  ? ?You have been scheduled for a colonoscopy. Please follow written instructions given to you at your visit today.  ? ?PREP:  ? ?Please pick up your prep supplies at the pharmacy within the next 1-3 days. ? ?INHALERS:  ? ?If you use inhalers (even only as needed), please bring them with you on the day of your procedure. ? ?COLONOSCOPY TIPS: ? ?To reduce nausea and dehydration, stay well hydrated for 3-4 days prior to the exam.  ?To prevent skin/hemorrhoid irritation - prior to wiping, put A&Dointment or vaseline on the toilet paper. ?Keep a towel or pad on the bed.  ?BEFORE STARTING YOUR PREP, drink  64oz of clear liquids in the morning. This will help to flush the colon and will ensure you are well hydrated!!!!  ?NOTE - This is in addition to the fluids required for to complete your prep. ?Use of a flavored hard candy, such as grape Anise Salvo, can counteract some of the flavor of the prep and may prevent some nausea.  ? ?IMAGING: ? ?You have been scheduled to have an anorectal manometry at [facility] on Wednesday 10/23/21 at 12:30 pm. Please arrive 30 minutes prior to your appointment time for registration (1st floor of the hospital-admissions).  Please make certain to use 1 Fleets enema 2 hours prior to coming for your appointment. You can purchase Fleets enemas from the laxative section at your drug store. You should not eat anything during the two hours prior to the procedure. You may take regular medications with small sips of water at least 2 hours prior to the study. ? ?Anorectal manometry is a test performed to evaluate patients with constipation or fecal incontinence. This test measures the pressures of the anal sphincter muscles, the sensation in the rectum, and the neural reflexes that are needed for normal bowel  movements. ? ?THE PROCEDURE ?The test takes approximately 30 minutes to 1 hour. You will be asked to change into a hospital gown. A technician or nurse will explain the procedure to you, take a brief health history, and answer any questions you may have. The patient then lies on his or her left side. A small, flexible tube, about the size of a thermometer, with a balloon at the end is inserted into the rectum. The catheter is connected to a machine that measures the pressure. During the test, the small balloon attached to the catheter may be inflated in the rectum to assess the normal reflex pathways. The nurse or technician may also ask the person to squeeze, relax, and push at various times. The anal sphincter muscle pressures are measured during each of these maneuvers. To squeeze, the patient tightens the sphincter muscles as if trying to prevent anything from coming out. To push or bear down, the patient strains down as if trying to have a bowel movement. ? ? ?FOLLOW UP: ? ?After your procedure, you will receive a call from my office staff regarding my recommendation for follow up. ? ?BMI: ? ?If you are age 22 or younger, your body mass index should be between 19-25. Your Body mass index is 24.68 kg/m?Marland Kitchen If this is out of the aformentioned range listed, please consider follow up with your Primary Care Provider.  ? ?MY CHART: ? ?The [facility] providers would like to encourage you to use MYCHART to communicate  with providers for non-urgent requests or questions.  Due to long hold times on the telephone, sending your provider a message by [facility] may be a faster and more efficient way to get a response.  Please allow 48 business hours for a response.  Please remember that this is for non-urgent requests.  ? ?Thank you for trusting me with your gastrointestinal care!   ? ?Thornton Park, MD, MPH ? ? ? ? ?

## 2021-07-14 ENCOUNTER — Encounter: Payer: Self-pay | Admitting: Gastroenterology

## 2021-07-21 ENCOUNTER — Ambulatory Visit (AMBULATORY_SURGERY_CENTER): Payer: Federal, State, Local not specified - PPO | Admitting: Gastroenterology

## 2021-07-21 ENCOUNTER — Encounter: Payer: Self-pay | Admitting: Gastroenterology

## 2021-07-21 ENCOUNTER — Telehealth: Payer: Self-pay | Admitting: *Deleted

## 2021-07-21 VITALS — BP 102/61 | HR 58 | Temp 98.9°F | Resp 22 | Ht 64.0 in | Wt 143.0 lb

## 2021-07-21 DIAGNOSIS — K6289 Other specified diseases of anus and rectum: Secondary | ICD-10-CM

## 2021-07-21 DIAGNOSIS — R194 Change in bowel habit: Secondary | ICD-10-CM

## 2021-07-21 DIAGNOSIS — K625 Hemorrhage of anus and rectum: Secondary | ICD-10-CM

## 2021-07-21 HISTORY — PX: COLONOSCOPY: SHX174

## 2021-07-21 MED ORDER — SODIUM CHLORIDE 0.9 % IV SOLN: 500.0000 mL | Freq: Once | INTRAVENOUS | Status: DC

## 2021-07-21 NOTE — Patient Instructions (Signed)
YOU HAD AN ENDOSCOPIC PROCEDURE TODAY AT THE [facility]:   Refer to the procedure report that was given to you for any specific questions about what was found during the examination.  If the procedure report does not answer your questions, please call your gastroenterologist to clarify.  If you requested that your care partner not be given the details of your procedure findings, then the procedure report has been included in a sealed envelope for you to review at your convenience later.  YOU SHOULD EXPECT: Some feelings of bloating in the abdomen. Passage of more gas than usual.  Walking can help get rid of the air that was put into your GI tract during the procedure and reduce the bloating. If you had a lower endoscopy (such as a colonoscopy or flexible sigmoidoscopy) you may notice spotting of blood in your stool or on the toilet paper. If you underwent a bowel prep for your procedure, you may not have a normal bowel movement for a few days.  Please Note:  You might notice some irritation and congestion in your nose or some drainage.  This is from the oxygen used during your procedure.  There is no need for concern and it should clear up in a day or so.  SYMPTOMS TO REPORT IMMEDIATELY:   Following lower endoscopy (colonoscopy or flexible sigmoidoscopy):  Excessive amounts of blood in the stool  Significant tenderness or worsening of abdominal pains  Swelling of the abdomen that is new, acute  Fever of 100F or higher  For urgent or emergent issues, a gastroenterologist can be reached at any hour by calling [phone]. Do not use MyChart messaging for urgent concerns.    DIET:  We do recommend a small meal at first, but then you may proceed to your regular diet.  Drink plenty of fluids but you should avoid alcoholic beverages for 24 hours.  ACTIVITY:  You should plan to take it easy for the rest of today and you should NOT DRIVE or use heavy machinery until tomorrow (because  of the sedation medicines used during the test).    FOLLOW UP: Our staff will call the number listed on your records 48-72 hours following your procedure to check on you and address any questions or concerns that you may have regarding the information given to you following your procedure. If we do not reach you, we will leave a message.  We will attempt to reach you two times.  During this call, we will ask if you have developed any symptoms of COVID 19. If you develop any symptoms (ie: fever, flu-like symptoms, shortness of breath, cough etc.) before then, please call [phone].  If you test positive for Covid 19 in the 2 weeks post procedure, please call and report this information to us.    If any biopsies were taken you will be contacted by phone or by letter within the next 1-3 weeks.  Please call us at [phone] if you have not heard about the biopsies in 3 weeks.    SIGNATURES/CONFIDENTIALITY: You and/or your care partner have signed paperwork which will be entered into your electronic medical record.  These signatures attest to the fact that that the information above on your After Visit Summary has been reviewed and is understood.  Full responsibility of the confidentiality of this discharge information lies with you and/or your care-partner. 

## 2021-07-21 NOTE — Op Note (Signed)
[facility] ?Patient Name: Ruth Phillips ?Procedure Date: 07/21/2021 10:56 AM ?MRN: [id number] ?Endoscopist: Tressia Danas MD, MD ?Age: 21 ?Referring MD:  ?Date of Birth: 2000/09/10 ?Gender: Female ?Account #: [id number] ?Procedure:                Colonoscopy ?Indications:              Rectal bleeding, Change in bowel habits ?Medicines:                Monitored Anesthesia Care ?Procedure:                Pre-Anesthesia Assessment: ?                          - Prior to the procedure, a History and Physical  ?                          was performed, and patient medications and  ?                          allergies were reviewed. The patient's tolerance of  ?                          previous anesthesia was also reviewed. The risks  ?                          and benefits of the procedure and the sedation  ?                          options and risks were discussed with the patient.  ?                          All questions were answered, and informed consent  ?                          was obtained. Prior Anticoagulants: The patient has  ?                          taken no previous anticoagulant or antiplatelet  ?                          agents. ASA Grade Assessment: II - A patient with  ?                          mild systemic disease. After reviewing the risks  ?                          and benefits, the patient was deemed in  ?                          satisfactory condition to undergo the procedure. ?                          After obtaining informed consent, the colonoscope  ?  was passed under direct vision. Throughout the  ?                          procedure, the patient's blood pressure, pulse, and  ?                          oxygen saturations were monitored continuously. The  ?                          CF [id number] #[id number] was introduced through the anus  ?                          and advanced to the 5 cm into the ileum. The  ?                          colonoscopy was  performed without difficulty. The  ?                          patient tolerated the procedure well. The quality  ?                          of the bowel preparation was excellent. The  ?                          terminal ileum, ileocecal valve, appendiceal  ?                          orifice, and rectum were photographed. ?Scope In: 11:04:21 AM ?Scope Out: 11:14:23 AM ?Scope Withdrawal Time: 0 hours 6 minutes 41 seconds  ?Total Procedure Duration: 0 hours 10 minutes 2 seconds  ?Findings:                 The perianal and digital rectal examinations were  ?                          normal except for small hemorrhoids. ?                          The entire examined colonic appeared normal. ?                          The terminal ileum appeared normal. ?Complications:            No immediate complications. ?Estimated Blood Loss:     Estimated blood loss: none. ?Impression:               - The entire examined colon and distal terminal  ?                          ileum is normal. ?                          - No specimens collected. ?Recommendation:           - Patient has a contact number available for  ?  emergencies. The signs and symptoms of potential  ?                          delayed complications were discussed with the  ?                          patient. Return to normal activities tomorrow.  ?                          Written discharge instructions were provided to the  ?                          patient. ?                          - Resume previous diet. ?                          - Add a daily dose of Metamucil or Benefiber. ?                          - Continue present medications. ?                          - Use Anusol HC 2.5% applied sparingly to your  ?                          rectum twice daily when bleeding occurs. ?                          - Start colon cancer screening at age 52. ?                          - Referral for pelvic floor PT. ?Tressia Danas MD, MD ?07/21/2021  11:20:56 AM ?This report has been signed electronically. ?

## 2021-07-21 NOTE — Telephone Encounter (Signed)
-----   Message from Tressia Danas, MD sent at 07/21/2021 11:15 AM EDT ----- ?Referral to [facility] for pelvic floor physical therapy. ? ?Thank you. ? ?

## 2021-07-21 NOTE — Progress Notes (Signed)
Pt's states no medical or surgical changes since previsit or office visit. 

## 2021-07-21 NOTE — Telephone Encounter (Signed)
Referral placed.

## 2021-07-21 NOTE — Progress Notes (Signed)
Indication for colonoscopy: Altered bowel habits x 6 months, Blood in the stool not explained by rectal exam, Rectal pain and irritation ? ?Please see my office note from 06/30/2021 for complete details.  There is been no change in history or physical exam since that time.  The patient remains an appropriate candidate for monitored anesthesia care in the endoscopy center. ?

## 2021-07-21 NOTE — Progress Notes (Signed)
Report to PACU, RN, vss, BBS= Clear.  

## 2021-07-25 ENCOUNTER — Telehealth: Payer: Self-pay

## 2021-07-25 NOTE — Telephone Encounter (Signed)
Left message on answering machine. 

## 2021-07-25 NOTE — Telephone Encounter (Signed)
Called [phone] and left a message we tried to reach pt for a follow up call. maw  ?

## 2021-07-28 ENCOUNTER — Ambulatory Visit: Payer: Federal, State, Local not specified - PPO | Admitting: Internal Medicine

## 2021-07-28 NOTE — Progress Notes (Deleted)
Name: Ruth Phillips  MRN/ DOB: [id number], 06-Dec-2000    Age/ Sex: 21 y.o., female     PCP: Ruth Salisbury, MD   Reason for Endocrinology Evaluation: Hyperthyroidism     Initial Endocrinology Clinic Visit: 10/13/2018    PATIENT IDENTIFIER: [name] is a 21 y.o., female with unremarkable past medical history. She has followed with [facility] since 10/13/2018 for consultative assistance with management of her hyperthyroidism     HISTORICAL SUMMARY:  The patient was first diagnosed with hyperthyroidism Secondary to Graves' Disease in 09/2018  after she noted an enlarging thyroid gland a few months prior to her presentation.   A thyroid ultrasound on 09/25/2018 shows a heterogenous gland with a left inferior nodule , separate from the thyroid gland, a CT scan of the neck demonstrated a17 x 21 mm soft tissue nodule anterior mediastinum just above the left innominate vein and below the left lobe of the thyroid corresponds to the ultrasound abnormality. This was deemed to be a thymus gland.    Methimazole was started 09/2018 , switched to PTU in 08/2019 due to metalic taste Mother with Grave's disease S/P RAI , maternal Grandfather with hyperthyroidism    SUBJECTIVE:    Today (07/28/2021):  Ruth Phillips is here for a follow up on hyperthyroidism secondary to Graves' Disease. She is accompanied by mother today .   Weight has been stable   Denies palpitations Has chronic loose stools , noted blood in stools 2 months ago intermittent   Denies dizziness  Denies nausea or vomiting   Denies neck swelling.     PTU 50 mg daily   Studies political science graduating in 2024    HISTORY:  Past Medical History:  Past Medical History:  Diagnosis Date   Allergy    Headache(784.0)    Past Surgical History: No past surgical history on file. Social History:  reports that she has never smoked. She has never used smokeless tobacco. She reports that she does not  drink alcohol and does not use drugs. Family History:  Family History  Problem Relation Age of Onset   Diabetes Mother    Thyroid disease Mother    Asthma Father      HOME MEDICATIONS: Allergies as of 07/28/2021   No Known Allergies      Medication List        Accurate as of Jul 28, 2021 12:15 PM. If you have any questions, ask your nurse or doctor.          methimazole 5 MG tablet Commonly known as: TAPAZOLE Take 1 tablet (5 mg total) by mouth daily.   XYZAL PO Take 1 tablet by mouth at bedtime.          OBJECTIVE:   PHYSICAL EXAM: VS: There were no vitals taken for this visit.   EXAM: General: Pt appears well and is in NAD  Neck: General: Supple without adenopathy. Thyroid: Thyroid size ~ 40 grams .  No nodules appreciated.   Lungs: Clear with good BS bilat with no rales, rhonchi, or wheezes  Heart: Auscultation: RRR.  Abdomen: Normoactive bowel sounds, soft, nontender, without masses or organomegaly palpable  Extremities:  BL LE: No pretibial edema normal ROM and strength.  Mental Status: Judgment, insight: Intact Orientation: Oriented to time, place, and person Mood and affect: No depression, anxiety, or agitation     DATA REVIEWED: Results for Ruth Phillips (MRN [id number]) as of 01/30/2021 10:53  Ref. Range 01/27/2021 14:36  Sodium  Latest Ref Range: 135 - 145 mEq/L 138  Potassium Latest Ref Range: 3.5 - 5.1 mEq/L 4.2  Chloride Latest Ref Range: 96 - 112 mEq/L 105  CO2 Latest Ref Range: 19 - 32 mEq/L 29  Glucose Latest Ref Range: 70 - 99 mg/dL 77  BUN Latest Ref Range: 6 - 23 mg/dL 12  Creatinine Latest Ref Range: 0.40 - 1.20 mg/dL 5.46  Calcium Latest Ref Range: 8.4 - 10.5 mg/dL 9.3  Alkaline Phosphatase Latest Ref Range: 39 - 117 U/L 61  Albumin Latest Ref Range: 3.5 - 5.2 g/dL 4.2  AST Latest Ref Range: 0 - 37 U/L 99 (H)  ALT Latest Ref Range: 0 - 35 U/L 126 (H)  Total Protein Latest Ref Range: 6.0 - 8.3 g/dL 7.3  Total Bilirubin  Latest Ref Range: 0.2 - 1.2 mg/dL 0.6  GGT Latest Ref Range: 7 - 51 U/L 63 (H)  GFR Latest Ref Range: >60.00 mL/min 82.81  WBC Latest Ref Range: 4.5 - 10.5 K/uL 4.5  RBC Latest Ref Range: 3.87 - 5.11 Mil/uL 4.50  Hemoglobin Latest Ref Range: 12.0 - 15.0 g/dL 27.0  HCT Latest Ref Range: 36.0 - 46.0 % 40.7  MCV Latest Ref Range: 78.0 - 100.0 fl 90.5  MCHC Latest Ref Range: 30.0 - 36.0 g/dL 35.0  RDW Latest Ref Range: 11.5 - 14.6 % 13.2  Platelets Latest Ref Range: 150.0 - 400.0 K/uL 207.0  Neutrophils Latest Ref Range: 43.0 - 77.0 % 48.2  Lymphocytes Latest Ref Range: 12.0 - 46.0 % 34.2  Monocytes Relative Latest Ref Range: 3.0 - 12.0 % 11.1  Eosinophil Latest Ref Range: 0.0 - 5.0 % 5.9 (H)  Basophil Latest Ref Range: 0.0 - 3.0 % 0.6  NEUT# Latest Ref Range: 1.4 - 7.7 K/uL 2.2  Lymphocyte # Latest Ref Range: 0.7 - 4.0 K/uL 1.5  Monocyte # Latest Ref Range: 0.1 - 1.0 K/uL 0.5  Eosinophils Absolute Latest Ref Range: 0.0 - 0.7 K/uL 0.3  Basophils Absolute Latest Ref Range: 0.0 - 0.1 K/uL 0.0  TSH Latest Ref Range: 0.35 - 5.50 uIU/mL 0.70  T4,Free(Direct) Latest Ref Range: 0.60 - 1.60 ng/dL 0.93     Results for Ruth Phillips (MRN [id number]) as of 12/08/2018 10:46  Ref. Range 10/10/2018 15:11  TRAB Latest Ref Range: <=2.00 IU/L 11.36 (H)    ASSESSMENT / PLAN / RECOMMENDATIONS:   Hyperthyroidism Secondary to Graves' Disease:   - Switched Methimazole to PTU due to metallic taste with Methimazole  - TFT's normal - She has been on thionamide therapy for 2 yrs without remission. We discussed RAI  - We extensively discussed the various treatment options for hyperthyroidism and Graves disease including ablation therapy with radioactive iodine versus antithyroid drug treatment versus surgical therapy.  We recommended to the patient that we felt, at this time, that she should consider RAI ablation or suggery  We discussed the various possible benefits versus side effects of the various  therapies.   I carefully explained to the patient that one of the consequences of I-131 ablation treatment would likely be permanent hypothyroidism which would require long-term replacement therapy with LT4.    Medications  Continue PTU 50 mg daily for now      2. Graves' Disease:  - No extra thyroidal manifestations of graves' disease.    3.  Elevated liver enzymes:  -Most likely related to PTU, this is not high enough to discontinue the PTU, but we did discuss alternatives such as radioactive iodine ablation versus total  thyroidectomy. -I have urged the pt to make a decision with alternative therapy   F/U in 6 months    Signed electronically by: Lyndle HerrlichAbby Jaralla Kenley Troop, MD  [facility]  [facility] [address] [place], [place] [address] Phone: [phone] FAX: [phone]      CC: Ruth SalisburyFry, Stephen A, MD [address] [place] [address] Phone: [phone]  Fax: [phone]   Return to Endocrinology clinic as below: Future Appointments  Date Time Provider Department Center  07/28/2021  3:00 PM Raheem Kolbe, Konrad DoloresIbtehal Jaralla, MD [id number] None

## 2021-08-10 ENCOUNTER — Ambulatory Visit: Payer: Federal, State, Local not specified - PPO | Attending: Family Medicine | Admitting: Physical Therapy

## 2021-08-10 DIAGNOSIS — M62838 Other muscle spasm: Secondary | ICD-10-CM | POA: Diagnosis not present

## 2021-08-10 DIAGNOSIS — R279 Unspecified lack of coordination: Secondary | ICD-10-CM | POA: Insufficient documentation

## 2021-08-10 DIAGNOSIS — M6281 Muscle weakness (generalized): Secondary | ICD-10-CM | POA: Diagnosis not present

## 2021-08-10 NOTE — Therapy (Signed)
OUTPATIENT PHYSICAL THERAPY FEMALE PELVIC EVALUATION   Patient Name: Ruth Phillips MRN: [id number] DOB:May 19, 2000, 21 y.o., female Today's Date: 08/10/2021    Past Medical History:  Diagnosis Date   Allergy    Headache(784.0)    No past surgical history on file. Patient Active Problem List   Diagnosis Date Noted   Graves disease 10/13/2018   Hyperthyroidism 10/13/2018   Concussion with no loss of consciousness 07/26/2016   Allergic rhinitis, seasonal 09/09/2010    PCP: Nelwyn Salisbury, MD  REFERRING PROVIDER: Tressia Danas, MD  REFERRING DIAG: K62.89 (ICD-10-CM) - Rectal pain  THERAPY DIAG:  Muscle weakness (generalized)  Other muscle spasm  Unspecified lack of coordination  Abnormal posture  Rationale for Evaluation and Treatment Rehabilitation  ONSET DATE: 04/2021  SUBJECTIVE:                                                                                                                                                                                           SUBJECTIVE STATEMENT: Pt reports she has difficulty with passing stool.  Fluid intake: Yes: 2 bottles of water, coffee and juice or soda frequently     Patient confirms identification and approves PT to assess pelvic floor and treatment Yes   PAIN:  Are you having pain? No   PRECAUTIONS: None  WEIGHT BEARING RESTRICTIONS No  FALLS:  Has patient fallen in last 6 months? No  LIVING ENVIRONMENT: Lives with: lives with their family Lives in: House/apartment   OCCUPATION: student for computer science  PLOF: Independent  PATIENT GOALS to have improved bowel habits  PERTINENT HISTORY:  hyperthyroidism secondary to Grave's disease Sexual abuse: No  BOWEL MOVEMENT Pain with bowel movement: No Type of bowel movement:Type (Bristol Stool Scale) 1, Frequency 2x per day, and Strain Yes Fully empty rectum: No Leakage: No Pads: No Fiber supplement: Yes: metamucil   URINATION Pain with  urination: No Fully empty bladder: Yes:   Stream: Strong Urgency: No Frequency: usually between 3-4 hours Leakage:  none Pads: No  INTERCOURSE Pain with intercourse:  no pain Ability to have vaginal penetration:  Yes:   Climax: no pain Marinoff Scale: 0/3  PREGNANCY Vaginal deliveries 0  C-section deliveries 0 Currently pregnant No  PROLAPSE None    OBJECTIVE:   DIAGNOSTIC FINDINGS:    COGNITION:  Overall cognitive status: Within functional limits for tasks assessed     SENSATION:  Light touch: Appears intact  Proprioception: Appears intact  MUSCLE LENGTH:  WFL     POSTURE:   WFL LUMBARAROM/PROM  WFL  LOWER EXTREMITY ROM:  WFL bil   LOWER EXTREMITY MMT: WFL bil LE  PELVIC MMT:   MMT eval  Vaginal   Internal Anal Sphincter 5/5   External Anal Sphincter 5/5 - 25s isometric, 10 reps  Puborectalis 5/5  Diastasis Recti No separation   (Blank rows = not tested)        PALPATION:   General  fascial restrictions in abdominal quadrants but no TTP                External Perineal Exam [facility]                             Internal Pelvic Floor WFL - assessed rectally   TONE: Very mildly increased but with cues able to demonstrate full relaxation   PROLAPSE: None seen at rectal assessment   TODAY'S TREATMENT  08/10/2021 EVAL Examination completed, findings reviewed, pt educated on POC, HEP, and voiding and breathing mechanics with use of step stool for bowel movements and balloon breathing technique to decrease strain for passing stool. Handouts given for fiber types and constipation information and abdominal massage. Pt motivated to participate in PT and agreeable to attempt recommendations.     PATIENT EDUCATION:  Education details: [email or web address] Person educated: Patient Education method: Explanation, Demonstration, Tactile cues, Verbal cues, and Handouts Education comprehension: verbalized understanding and returned demonstration   HOME EXERCISE  PROGRAM: [email or web address]  ASSESSMENT:  CLINICAL IMPRESSION: Patient is a 21 y.o. female  who was seen today for physical therapy evaluation and treatment for referral for rectal pain however pt reports she has no pain but her concern has been for constipation. Pt does report she has had rectal bleeding with attempts to strain to empty bowels. Pt had no restrictions in mobility, no pain during assessment, did have fascial restrictions in abdomen but without pain. Pt consented to internal rectal assessment and had WFL tone, strength, endurance however with attempt to relax and bulge "as if to pass stool" pt demonstrated max contraction at EAS. Pt educated on balloon breathing technique and pt immediately had full relaxation and reports she could feel the difference and understands what to do. X5 more reps completed with consistent demonstration of full relaxation when needed. Pt educated on voiding mechanics with use of step stool in bathroom for all bowel movements, balloon breaths to relax pelvic floor and decrease straining. Pt also given HEP, handouts for constipation, fiber types, and abdominal massage. Pt denied additional questions and very motivated to attempt these techniques at home. Pt would benefit from additional PT to further address deficits.      OBJECTIVE IMPAIRMENTS decreased coordination, increased fascial restrictions, and improper body mechanics.   ACTIVITY LIMITATIONS continence  PARTICIPATION LIMITATIONS: community activity  PERSONAL FACTORS Time since onset of injury/illness/exacerbation are also affecting patient's functional outcome.   REHAB POTENTIAL: Good  CLINICAL DECISION MAKING: Stable/uncomplicated  EVALUATION COMPLEXITY: Low   GOALS: Goals reviewed with patient? Yes  SHORT TERM GOALS: Target date: 09/07/2021  Pt to be I with HEP.  Baseline: Goal status: INITIAL  2.  Pt will report her BMs are complete due to improved bowel habits and evacuation techniques.   Baseline:  Goal status: INITIAL    LONG TERM GOALS: Target date:  10/10/2021    Pt to be I with advanced HEP.  Baseline:  Goal status: INITIAL  2.  Pt will report 75% less abdominal bloating and discomfort due to improved muscle tone throughout the core and pelvic floor relaxation for complete bowel evacuation.   Baseline:  Goal  status: INITIAL  3.  Pt to demonstrate improved coordination of pelvic floor and breathing for full relaxation and bulge ability without valsalva technique at least 75% of the time for improved bowel habits.  Baseline:  Goal status: INITIAL   PLAN: PT FREQUENCY:  once every other week  PT DURATION:  4 sessions  PLANNED INTERVENTIONS: Therapeutic exercises, Therapeutic activity, Neuromuscular re-education, Patient/Family education, Joint mobilization, Dry Needling, Spinal mobilization, Cryotherapy, Moist heat, Manual lymph drainage, scar mobilization, Taping, Biofeedback, and Manual therapy  PLAN FOR NEXT SESSION: review handouts, breathing and voiding techniques, pelvic relaxation, abdominal manual work    Otelia SergeantHaley Jaquil Todt, PT, DPT 05/25/232:43 PM

## 2021-08-10 NOTE — Patient Instructions (Addendum)
? ? ?Types of Fiber ? ?There are two main types of fiber:  insoluble and soluble.  Both of these types can prevent and relieve constipation and diarrhea, although some people find one or the other to be more easily digested.  This handout details information about both types of fiber. ? ?Insoluble Fiber ? ?     Functions of Insoluble Fiber ?moves bulk through the intestines  ?controls and balances the pH (acidity) in the intestines ? ?     Benefits of Insoluble Fiber ?promotes regular bowel movement and prevents constipation  ?removes fecal waste through colon in less time  ?keeps an optimal pH in intestines to prevent microbes from producing cancer substances, therefore preventing colon cancer  ? ?     Food Sources of Insoluble Fiber ?whole-wheat products  ?wheat bran ?miller?s bran? ?corn bran  ?flax seed or other seeds ?vegetables such as green beans, broccoli, cauliflower and potato skins  ?fruit skins and root vegetable skins  ?popcorn ?brown rice ? ?Soluble Fiber ? ?     Functions of Soluble Fiber  ?holds water in the colon to bulk and soften the stool ?prolongs stomach emptying time so that sugar is released and absorbed more slowly  ? ?     Benefits of Soluble Fiber ?lowers total cholesterol and LDL cholesterol (the bad cholesterol) therefore reducing the risk of heart disease  ?regulates blood sugar for people with diabetes  ? ?    Food Sources of Soluble Fiber ?oat/oat bran ?dried beans and peas  ?nuts  ?barley  ?flax seed or other seeds ?fruits such as oranges, pears, peaches, and apples  ?vegetables such as carrots  ?psyllium husk  ?prunes ? ?About Constipation ? ?Constipation Overview ?Constipation is the most common gastrointestinal complaint -- about 4 million Americans experience constipation and make 2.5 million physician visits a year to get help for the problem.  Constipation can occur when the colon absorbs too much water, the colon?s muscle contraction is slow or sluggish, and/or there is  delayed transit time through the colon.  The result is stool that is hard and dry.  Indicators of constipation include straining during bowel movements greater than 25% of the time, having fewer than three bowel movements per week, and/or the feeling of incomplete evacuation. ? ?There are established guidelines ([place] II ) for defining constipation. A person needs to have two or more of the following symptoms for at least 12 weeks (not necessarily consecutive) in the preceding 12 months: ?Straining in  greater than 25% of bowel movements ?Lumpy or hard stools in greater than 25% of bowel movements ?Sensation of incomplete emptying in greater than 25% of bowel movements ?Sensation of anorectal obstruction/blockade in greater than 25% of bowel movements ?Manual maneuvers to help empty greater than 25% of bowel movements (e.g., digital evacuation, support of the pelvic floor)  ?Less than  3 bowel movements/week ?Loose stools are not present, and criteria for irritable bowel syndrome are insufficient ?Common Causes of Constipation ?lack of fiber in your diet ?lack of physical activity ?medications, including iron and calcium supplements  ?dairy intake ?dehydration ?abuse of laxatives ?travel ?Irritable Bowel Syndrome ?pregnancy ?luteal phase of menstruation (after ovulation and before menses) ?colorectal problems ?intestinal dysfunction ? ?Treating Constipation ? ?There are several ways of treating constipation, including changes to diet and exercise, use of laxatives, adjustments to the pelvic floor, and scheduled toileting.  These treatments include: ?increasing fiber and fluids in the diet  ?increasing physical activity ?learning muscle coordination  ?  learning proper toileting techniques and toileting modifications  ?designing and sticking  to a toileting schedule ? ?

## 2021-10-18 ENCOUNTER — Encounter (HOSPITAL_COMMUNITY): Payer: Self-pay | Admitting: Gastroenterology

## 2021-10-18 NOTE — Progress Notes (Signed)
Attempted to obtain medical history via telephone, unable to reach at this time. HIPAA compliant voicemail message left requesting return call to pre surgical testing department. 

## 2021-10-23 DIAGNOSIS — B009 Herpesviral infection, unspecified: Secondary | ICD-10-CM | POA: Diagnosis not present

## 2021-10-24 ENCOUNTER — Other Ambulatory Visit: Payer: Self-pay | Admitting: Internal Medicine

## 2021-10-25 ENCOUNTER — Ambulatory Visit (HOSPITAL_COMMUNITY)
Admission: RE | Admit: 2021-10-25 | Payer: Federal, State, Local not specified - PPO | Source: Home / Self Care | Admitting: Gastroenterology

## 2021-10-25 ENCOUNTER — Encounter (HOSPITAL_COMMUNITY): Admission: RE | Payer: Self-pay | Source: Home / Self Care

## 2021-10-25 SURGERY — MANOMETRY, ANORECTAL

## 2021-10-25 NOTE — Progress Notes (Signed)
Patient called today to inquire about appointment for anorectal manometry.  Patient unaware of any instructions including enema prior to procedure.  Patient requested to cancel appointment for today and to reschedule.  Gave the patient phone number for [facility] and instructed her to call office to reschedule.  Will notify Dr Tressia Danas.

## 2021-11-24 ENCOUNTER — Encounter: Payer: Self-pay | Admitting: Internal Medicine

## 2021-11-24 ENCOUNTER — Ambulatory Visit: Payer: Federal, State, Local not specified - PPO | Admitting: Internal Medicine

## 2021-11-24 VITALS — BP 118/70 | HR 78 | Ht 64.0 in | Wt 144.0 lb

## 2021-11-24 DIAGNOSIS — E0590 Thyrotoxicosis, unspecified without thyrotoxic crisis or storm: Secondary | ICD-10-CM

## 2021-11-24 DIAGNOSIS — E0500 Thyrotoxicosis with diffuse goiter without thyrotoxic crisis or storm: Secondary | ICD-10-CM

## 2021-11-24 DIAGNOSIS — R7989 Other specified abnormal findings of blood chemistry: Secondary | ICD-10-CM

## 2021-11-24 LAB — CBC WITH DIFFERENTIAL/PLATELET
Basophils Absolute: 0 10*3/uL (ref 0.0–0.1)
Basophils Relative: 0.5 % (ref 0.0–3.0)
Eosinophils Absolute: 0.1 10*3/uL (ref 0.0–0.7)
Eosinophils Relative: 1.6 % (ref 0.0–5.0)
HCT: 37.9 % (ref 36.0–46.0)
Hemoglobin: 12.8 g/dL (ref 12.0–15.0)
Lymphocytes Relative: 35.2 % (ref 12.0–46.0)
Lymphs Abs: 1.3 10*3/uL (ref 0.7–4.0)
MCHC: 33.9 g/dL (ref 30.0–36.0)
MCV: 87.6 fl (ref 78.0–100.0)
Monocytes Absolute: 0.4 10*3/uL (ref 0.1–1.0)
Monocytes Relative: 11.6 % (ref 3.0–12.0)
Neutro Abs: 1.8 10*3/uL (ref 1.4–7.7)
Neutrophils Relative %: 51.1 % (ref 43.0–77.0)
Platelets: 274 10*3/uL (ref 150.0–400.0)
RBC: 4.32 Mil/uL (ref 3.87–5.11)
RDW: 13.7 % (ref 11.5–15.5)
WBC: 3.6 10*3/uL — ABNORMAL LOW (ref 4.0–10.5)

## 2021-11-24 LAB — COMPREHENSIVE METABOLIC PANEL
ALT: 11 U/L (ref 0–35)
AST: 20 U/L (ref 0–37)
Albumin: 4.1 g/dL (ref 3.5–5.2)
Alkaline Phosphatase: 48 U/L (ref 39–117)
BUN: 9 mg/dL (ref 6–23)
CO2: 27 mEq/L (ref 19–32)
Calcium: 9.3 mg/dL (ref 8.4–10.5)
Chloride: 106 mEq/L (ref 96–112)
Creatinine, Ser: 0.87 mg/dL (ref 0.40–1.20)
GFR: 94.98 mL/min (ref >60.00)
Glucose, Bld: 91 mg/dL (ref 70–99)
Potassium: 3.9 mEq/L (ref 3.5–5.1)
Sodium: 140 mEq/L (ref 135–145)
Total Bilirubin: 0.4 mg/dL (ref 0.2–1.2)
Total Protein: 7.5 g/dL (ref 6.0–8.3)

## 2021-11-24 LAB — T4, FREE: Free T4: 0.75 ng/dL (ref 0.60–1.60)

## 2021-11-24 LAB — TSH: TSH: 5.12 u[IU]/mL (ref 0.35–5.50)

## 2021-11-24 NOTE — Progress Notes (Unsigned)
Name: Francys Bolin  MRN/ DOB: [id number], May 16, 2000    Age/ Sex: 21 y.o., female     PCP: Nelwyn Salisbury, MD   Reason for Endocrinology Evaluation: Hyperthyroidism     Initial Endocrinology Clinic Visit: 10/13/2018    PATIENT IDENTIFIER: [name] is a 21 y.o., female with unremarkable past medical history. She has followed with [facility] since 10/13/2018 for consultative assistance with management of her hyperthyroidism     HISTORICAL SUMMARY:  The patient was first diagnosed with hyperthyroidism Secondary to Graves' Disease in 09/2018  after she noted an enlarging thyroid gland a few months prior to her presentation.   A thyroid ultrasound on 09/25/2018 shows a heterogenous gland with a left inferior nodule , separate from the thyroid gland, a CT scan of the neck demonstrated a17 x 21 mm soft tissue nodule anterior mediastinum just above the left innominate vein and below the left lobe of the thyroid corresponds to the ultrasound abnormality. This was deemed to be a thymus gland.    Methimazole was started 09/2018 , switched to PTU in 08/2019 due to metalic taste but du to elevated LFT's and declining RAI she opted to switch back to Methimazole by 02/2021   LFT's normalized after switching to methimazole     Mother with Grave's disease S/P RAI , maternal Grandfather with hyperthyroidism    SUBJECTIVE:    Today (11/24/2021):  Ms. Johannsen is here for a follow up on hyperthyroidism secondary to Graves' Disease.   She has been following with GI for rectal pain, rending rectal manometry   Delford Field continues to fluctuate  Denies palpitations Denies tremors  Continue with loose stools  Denies neck swelling Denies burning and itching  She is on OCP's     Methimazole 5 mg daily     HISTORY:  Past Medical History:  Past Medical History:  Diagnosis Date   Allergy    Headache(784.0)    Past Surgical History: No past surgical history on  file. Social History:  reports that she has never smoked. She has never used smokeless tobacco. She reports that she does not drink alcohol and does not use drugs. Family History:  Family History  Problem Relation Age of Onset   Diabetes Mother    Thyroid disease Mother    Asthma Father      HOME MEDICATIONS: Allergies as of 11/24/2021   No Known Allergies      Medication List        Accurate as of November 24, 2021  6:48 AM. If you have any questions, ask your nurse or doctor.          methimazole 5 MG tablet Commonly known as: TAPAZOLE TAKE 1 TABLET(5 MG) BY MOUTH DAILY   XYZAL PO Take 1 tablet by mouth at bedtime.          OBJECTIVE:   PHYSICAL EXAM: VS: There were no vitals taken for this visit.   EXAM: General: Pt appears well and is in NAD  Neck: General: Supple without adenopathy. Thyroid: Thyroid size ~ 40 grams .  No nodules appreciated.  Right thyroid bruit detected   Lungs: Clear with good BS bilat with no rales, rhonchi, or wheezes  Heart: Auscultation: RRR.  Abdomen: Normoactive bowel sounds, soft, nontender, without masses or organomegaly palpable  Extremities:  BL LE: No pretibial edema normal ROM and strength.  Mental Status: Judgment, insight: Intact Orientation: Oriented to time, place, and person Mood and affect: No depression, anxiety,  or agitation     DATA REVIEWED:  Latest Reference Range & Units 11/24/21 12:48  Sodium 135 - 145 mEq/L 140  Potassium 3.5 - 5.1 mEq/L 3.9  Chloride 96 - 112 mEq/L 106  CO2 19 - 32 mEq/L 27  Glucose 70 - 99 mg/dL 91  BUN 6 - 23 mg/dL 9  Creatinine 7.12 - 4.58 mg/dL 0.99  Calcium 8.4 - 83.3 mg/dL 9.3  Alkaline Phosphatase 39 - 117 U/L 48  Albumin 3.5 - 5.2 g/dL 4.1  AST 0 - 37 U/L 20  ALT 0 - 35 U/L 11  Total Protein 6.0 - 8.3 g/dL 7.5  Total Bilirubin 0.2 - 1.2 mg/dL 0.4  GFR >82.50 mL/min 94.98    Latest Reference Range & Units 11/24/21 12:48  WBC 4.0 - 10.5 K/uL 3.6 (L)  RBC 3.87 - 5.11  Mil/uL 4.32  Hemoglobin 12.0 - 15.0 g/dL 53.9  HCT 76.7 - 34.1 % 37.9  MCV 78.0 - 100.0 fl 87.6  MCHC 30.0 - 36.0 g/dL 93.7  RDW 90.2 - 40.9 % 13.7  Platelets 150.0 - 400.0 K/uL 274.0  Neutrophils 43.0 - 77.0 % 51.1  Lymphocytes 12.0 - 46.0 % 35.2  Monocytes Relative 3.0 - 12.0 % 11.6  Eosinophil 0.0 - 5.0 % 1.6  Basophil 0.0 - 3.0 % 0.5  NEUT# 1.4 - 7.7 K/uL 1.8  Lymphocyte # 0.7 - 4.0 K/uL 1.3  Monocyte # 0.1 - 1.0 K/uL 0.4  Eosinophils Absolute 0.0 - 0.7 K/uL 0.1  Basophils Absolute 0.0 - 0.1 K/uL 0.0  Glucose 70 - 99 mg/dL 91  TSH 7.35 - 3.29 uIU/mL 5.12  Triiodothyronine (T3) 76 - 181 ng/dL 924  Q6,STMH(DQQIWL) 7.98 - 1.60 ng/dL 9.21      Results for PETULA, ROTOLO (MRN [id number]) as of 12/08/2018 10:46  Ref. Range 10/10/2018 15:11  TRAB Latest Ref Range: <=2.00 IU/L 11.36 (H)    ASSESSMENT / PLAN / RECOMMENDATIONS:   Hyperthyroidism Secondary to Graves' Disease:   - Pt is clinically euthyroid  - She continues to decline RAI ablation  - TSH is elevated, will reduce methimazole as below    Medications  Take Methimazole 5 mg, 1 tablet MOnday through Saturday only ( none on Sundays)     2. Graves' Disease:  - No extra thyroidal manifestations of graves' disease.    3.  Elevated liver enzymes:  -This was related to  PTU - LFT's normalized with switching to Methimazole   F/U in 6 months    Signed electronically by: Lyndle Herrlich, MD  [facility]  [facility] [address] [place] [place], [place] [address] Phone: [phone] FAX: [phone]      CC: Nelwyn Salisbury, MD [address] [place] [address] Phone: [phone]  Fax: [phone]   Return to Endocrinology clinic as below: Future Appointments  Date Time Provider Department Center  11/24/2021 12:10 PM Marquite Attwood, Konrad Dolores, MD [facility] None

## 2021-11-25 LAB — T3: T3, Total: 107 ng/dL (ref 76–181)

## 2021-11-26 MED ORDER — METHIMAZOLE 5 MG PO TABS: 5.0000 mg | ORAL_TABLET | ORAL | 3 refills | Status: DC

## 2022-01-19 DIAGNOSIS — Z309 Encounter for contraceptive management, unspecified: Secondary | ICD-10-CM | POA: Diagnosis not present

## 2022-01-19 DIAGNOSIS — Z3041 Encounter for surveillance of contraceptive pills: Secondary | ICD-10-CM | POA: Diagnosis not present

## 2022-02-26 ENCOUNTER — Telehealth: Payer: Self-pay

## 2022-02-26 NOTE — Telephone Encounter (Signed)
-----   Message from Tressia Danas, MD sent at 02/26/2022 12:42 PM EST ----- Regarding: FW: patient canceled manometry procedure Cancelled anorectal manometry again. Please office office visit with me or an APP.  Thanks.  KLB ----- Message ----- From: Vernelle Emerald, RN Sent: 02/26/2022  11:55 AM EST To: Tressia Danas, MD Subject: patient canceled manometry procedure           Dr Orvan Falconer I just wanted to let you know that Ruth Phillips called to cancel her Manometry procedure that was scheduled for this Wednesday.  Thanks [employer]

## 2022-02-27 DIAGNOSIS — L292 Pruritus vulvae: Secondary | ICD-10-CM | POA: Diagnosis not present

## 2022-02-27 DIAGNOSIS — N760 Acute vaginitis: Secondary | ICD-10-CM | POA: Diagnosis not present

## 2022-02-27 NOTE — Telephone Encounter (Signed)
Left detailed message for patient to call back & schedule OV.

## 2022-02-28 ENCOUNTER — Encounter (HOSPITAL_COMMUNITY): Admission: RE | Payer: Self-pay | Source: Ambulatory Visit

## 2022-02-28 ENCOUNTER — Ambulatory Visit (HOSPITAL_COMMUNITY)
Admission: RE | Admit: 2022-02-28 | Payer: Federal, State, Local not specified - PPO | Source: Ambulatory Visit | Admitting: Gastroenterology

## 2022-02-28 SURGERY — MANOMETRY, ANORECTAL

## 2022-02-28 NOTE — Telephone Encounter (Signed)
Mychart message sent to patient.

## 2022-05-23 NOTE — Progress Notes (Deleted)
Name: Ruth Phillips  MRN/ DOB: [id number], 07-23-2000    Age/ Sex: 22 y.o., female     PCP: Laurey Morale, MD   Reason for Endocrinology Evaluation: Hyperthyroidism     Initial Endocrinology Clinic Visit: 10/13/2018    PATIENT IDENTIFIER: Ruth Phillips is a 22 y.o., female with unremarkable past medical history. She has followed with [facility] since 10/13/2018 for consultative assistance with management of her hyperthyroidism     HISTORICAL SUMMARY:  The patient was first diagnosed with hyperthyroidism Secondary to Graves' Disease in 09/2018  after she noted an enlarging thyroid gland a few months prior to her presentation.   A thyroid ultrasound on 09/25/2018 shows a heterogenous gland with a left inferior nodule , separate from the thyroid gland, a CT scan of the neck demonstrated a17 x 21 mm soft tissue nodule anterior mediastinum just above the left innominate vein and below the left lobe of the thyroid corresponds to the ultrasound abnormality. This was deemed to be a thymus gland.    Methimazole was started 09/2018 , switched to PTU in [id number] due to metalic taste but du to elevated LFT's and declining RAI she opted to switch back to Methimazole by 02/2021   LFT's normalized after switching to methimazole     Mother with Grave's disease S/P RAI , maternal Grandfather with hyperthyroidism    SUBJECTIVE:    Today (05/23/2022):  Ruth Phillips is here for a follow up on hyperthyroidism secondary to Graves' Disease.   She has been following with GI for rectal pain, rending rectal manometry   Joya Gaskins continues to fluctuate  Denies palpitations Denies tremors  Continue with loose stools  Denies neck swelling Denies burning and itching  She is on OCP's     Methimazole 5 mg daily     HISTORY:  Past Medical History:  Past Medical History:  Diagnosis Date   Allergy    Headache(784.0)    Past Surgical History: No past surgical history on  file. Social History:  reports that she has never smoked. She has never used smokeless tobacco. She reports that she does not drink alcohol and does not use drugs. Family History:  Family History  Problem Relation Age of Onset   Diabetes Mother    Thyroid disease Mother    Asthma Father      HOME MEDICATIONS: Allergies as of 05/25/2022   No Known Allergies      Medication List        Accurate as of May 23, 2022 10:11 AM. If you have any questions, ask your nurse or doctor.          Lo Loestrin Fe 1 MG-10 MCG / 10 MCG tablet Generic drug: Norethindrone-Ethinyl Estradiol-Fe Biphas Take 1 tablet by mouth daily.   methimazole 5 MG tablet Commonly known as: TAPAZOLE Take 1 tablet (5 mg total) by mouth as directed. Take 1 tablet Mondays through Saturdays and none on Sundays   valACYclovir 500 MG tablet Commonly known as: VALTREX Take 500 mg by mouth daily.   XYZAL PO Take 1 tablet by mouth at bedtime.          OBJECTIVE:   PHYSICAL EXAM: VS: There were no vitals taken for this visit.   EXAM: General: Pt appears well and is in NAD  Neck: General: Supple without adenopathy. Thyroid: Thyroid size ~ 40 grams .  No nodules appreciated.  Right thyroid bruit detected   Lungs: Clear with good BS bilat with no  rales, rhonchi, or wheezes  Heart: Auscultation: RRR.  Abdomen: Normoactive bowel sounds, soft, nontender, without masses or organomegaly palpable  Extremities:  BL LE: No pretibial edema normal ROM and strength.  Mental Status: Judgment, insight: Intact Orientation: Oriented to time, place, and person Mood and affect: No depression, anxiety, or agitation     DATA REVIEWED:  Latest Reference Range & Units 11/24/21 12:48  Sodium 135 - 145 mEq/L 140  Potassium 3.5 - 5.1 mEq/L 3.9  Chloride 96 - 112 mEq/L 106  CO2 19 - 32 mEq/L 27  Glucose 70 - 99 mg/dL 91  BUN 6 - 23 mg/dL 9  Creatinine 0.40 - 1.20 mg/dL 0.87  Calcium 8.4 - 10.5 mg/dL 9.3  Alkaline  Phosphatase 39 - 117 U/L 48  Albumin 3.5 - 5.2 g/dL 4.1  AST 0 - 37 U/L 20  ALT 0 - 35 U/L 11  Total Protein 6.0 - 8.3 g/dL 7.5  Total Bilirubin 0.2 - 1.2 mg/dL 0.4  GFR >60.00 mL/min 94.98    Latest Reference Range & Units 11/24/21 12:48  WBC 4.0 - 10.5 K/uL 3.6 (L)  RBC 3.87 - 5.11 Mil/uL 4.32  Hemoglobin 12.0 - 15.0 g/dL 12.8  HCT 36.0 - 46.0 % 37.9  MCV 78.0 - 100.0 fl 87.6  MCHC 30.0 - 36.0 g/dL 33.9  RDW 11.5 - 15.5 % 13.7  Platelets 150.0 - 400.0 K/uL 274.0  Neutrophils 43.0 - 77.0 % 51.1  Lymphocytes 12.0 - 46.0 % 35.2  Monocytes Relative 3.0 - 12.0 % 11.6  Eosinophil 0.0 - 5.0 % 1.6  Basophil 0.0 - 3.0 % 0.5  NEUT# 1.4 - 7.7 K/uL 1.8  Lymphocyte # 0.7 - 4.0 K/uL 1.3  Monocyte # 0.1 - 1.0 K/uL 0.4  Eosinophils Absolute 0.0 - 0.7 K/uL 0.1  Basophils Absolute 0.0 - 0.1 K/uL 0.0  Glucose 70 - 99 mg/dL 91  TSH 0.35 - 5.50 uIU/mL 5.12  Triiodothyronine (T3) 76 - 181 ng/dL 107  T4,Free(Direct) 0.60 - 1.60 ng/dL 0.75      Results for CORDULA, WARR (MRN [id number]) as of 12/08/2018 10:46  Ref. Range 10/10/2018 15:11  TRAB Latest Ref Range: <=2.00 IU/L 11.36 (H)    ASSESSMENT / PLAN / RECOMMENDATIONS:   Hyperthyroidism Secondary to Graves' Disease:   - Pt is clinically euthyroid  - She continues to decline RAI ablation  - TSH is elevated, will reduce methimazole as below    Medications  Take Methimazole 5 mg, 1 tablet MOnday through Saturday only ( none on Sundays)     2. Graves' Disease:  - No extra thyroidal manifestations of graves' disease.    3.  Elevated liver enzymes:  -This was related to  PTU - LFT's normalized with switching to Methimazole      F/U in 6 months    Signed electronically by: Mack Guise, MD  [facility]  [facility] [address] [place] [address] Phone: [phone] FAX: [phone]      CC: Laurey Morale, [place]  [address] Phone: [phone]  Fax: [phone]   Return to Endocrinology clinic as below: Future Appointments  Date Time Provider [place]  05/25/2022  8:50 AM Antino Mayabb, Melanie Crazier, MD [facility] None

## 2022-05-25 ENCOUNTER — Ambulatory Visit: Payer: Federal, State, Local not specified - PPO | Admitting: Internal Medicine

## 2022-08-29 ENCOUNTER — Encounter: Payer: Federal, State, Local not specified - PPO | Admitting: Family Medicine

## 2022-09-13 ENCOUNTER — Ambulatory Visit (INDEPENDENT_AMBULATORY_CARE_PROVIDER_SITE_OTHER): Payer: Federal, State, Local not specified - PPO | Admitting: Podiatry

## 2022-09-13 DIAGNOSIS — Q666 Other congenital valgus deformities of feet: Secondary | ICD-10-CM | POA: Diagnosis not present

## 2022-09-13 NOTE — Progress Notes (Signed)
  Subjective:  Patient ID: Ruth Phillips, female    DOB: 2000/05/15,  MRN: [id number]  Chief Complaint  Patient presents with   Callouses    Pt stated that she has corns     22 y.o. female presents with the above complaint.  Patient presents with complaint of bilateral pes planovalgus deformity present present for quite some time.  She states she started getting corns and calluses due to being flat-footed and not being supportive.  She started get some arch and heel pain.  She has not seen anyone as prior to seeing me pain scale is 5 out of 10 dull aching nature she has some new fitting shoes as well.  Denies any other acute complaint she has not seen anyone else prior to seeing me   Review of Systems: Negative except as noted in the HPI. Denies N/V/F/Ch.  Past Medical History:  Diagnosis Date   Allergy    Headache(784.0)     Current Outpatient Medications:    Levocetirizine Dihydrochloride (XYZAL PO), Take 1 tablet by mouth at bedtime., Disp: , Rfl:    LO LOESTRIN FE 1 MG-10 MCG / 10 MCG tablet, Take 1 tablet by mouth daily., Disp: , Rfl:    methimazole (TAPAZOLE) 5 MG tablet, Take 1 tablet (5 mg total) by mouth as directed. Take 1 tablet Mondays through Saturdays and none on Sundays, Disp: 78 tablet, Rfl: 3   valACYclovir (VALTREX) 500 MG tablet, Take 500 mg by mouth daily., Disp: , Rfl:   Social History   Tobacco Use  Smoking Status Never  Smokeless Tobacco Never    No Known Allergies Objective:  There were no vitals filed for this visit. There is no height or weight on file to calculate BMI. Constitutional Well developed. Well nourished.  Vascular Dorsalis pedis pulses palpable bilaterally. Posterior tibial pulses palpable bilaterally. Capillary refill normal to all digits.  No cyanosis or clubbing noted. Pedal hair growth normal.  Neurologic Normal speech. Oriented to person, place, and time. Epicritic sensation to light touch grossly present bilaterally.   Dermatologic Nails well groomed and normal in appearance. No open wounds. No skin lesions.  Orthopedic: Gait examination shows pes planovalgus deformity with calcaneovalgus to many toe signs partially able to recruit the arch with dorsiflexion of the hallux   Radiographs: None Assessment:  No diagnosis found. Plan:  Patient was evaluated and treated and all questions answered.  Pes planovalgus -I explained to patient the etiology of pes planovalgus and relationship with Planter fasciitis/arch pain and various treatment options were discussed.  Given patient foot structure in the setting of Planter fasciitis/arch pain I believe patient will benefit from custom-made orthotics to help control the hindfoot motion support the arch of the foot and take the stress away from plantar fascial.  Patient agrees with the plan like to proceed with orthotics -Patient was casted for orthotics   No follow-ups on file.

## 2022-09-14 ENCOUNTER — Encounter: Payer: Self-pay | Admitting: Family Medicine

## 2022-09-14 ENCOUNTER — Ambulatory Visit (INDEPENDENT_AMBULATORY_CARE_PROVIDER_SITE_OTHER): Payer: Federal, State, Local not specified - PPO | Admitting: Family Medicine

## 2022-09-14 VITALS — BP 110/70 | HR 71 | Temp 98.1°F | Ht 65.0 in | Wt 142.2 lb

## 2022-09-14 DIAGNOSIS — Z0000 Encounter for general adult medical examination without abnormal findings: Secondary | ICD-10-CM

## 2022-09-14 DIAGNOSIS — Z23 Encounter for immunization: Secondary | ICD-10-CM | POA: Diagnosis not present

## 2022-09-14 DIAGNOSIS — E0590 Thyrotoxicosis, unspecified without thyrotoxic crisis or storm: Secondary | ICD-10-CM

## 2022-09-14 DIAGNOSIS — Z13220 Encounter for screening for lipoid disorders: Secondary | ICD-10-CM

## 2022-09-14 LAB — CBC WITH DIFFERENTIAL/PLATELET
Basophils Absolute: 0 10*3/uL (ref 0.0–0.1)
Basophils Relative: 0.6 % (ref 0.0–3.0)
Eosinophils Absolute: 0.1 10*3/uL (ref 0.0–0.7)
Eosinophils Relative: 3.8 % (ref 0.0–5.0)
HCT: 41.5 % (ref 36.0–46.0)
Hemoglobin: 14 g/dL (ref 12.0–15.0)
Lymphocytes Relative: 41.7 % (ref 12.0–46.0)
Lymphs Abs: 1.5 10*3/uL (ref 0.7–4.0)
MCHC: 33.7 g/dL (ref 30.0–36.0)
MCV: 90.1 fl (ref 78.0–100.0)
Monocytes Absolute: 0.4 10*3/uL (ref 0.1–1.0)
Monocytes Relative: 11.5 % (ref 3.0–12.0)
Neutro Abs: 1.6 10*3/uL (ref 1.4–7.7)
Neutrophils Relative %: 42.4 % — ABNORMAL LOW (ref 43.0–77.0)
Platelets: 272 10*3/uL (ref 150.0–400.0)
RBC: 4.61 Mil/uL (ref 3.87–5.11)
RDW: 13 % (ref 11.5–15.5)
WBC: 3.7 10*3/uL — ABNORMAL LOW (ref 4.0–10.5)

## 2022-09-14 LAB — BASIC METABOLIC PANEL
BUN: 12 mg/dL (ref 6–23)
CO2: 27 mEq/L (ref 19–32)
Calcium: 9.6 mg/dL (ref 8.4–10.5)
Chloride: 104 mEq/L (ref 96–112)
Creatinine, Ser: 0.78 mg/dL (ref 0.40–1.20)
GFR: 107.67 mL/min (ref >60.00)
Glucose, Bld: 85 mg/dL (ref 70–99)
Potassium: 3.9 mEq/L (ref 3.5–5.1)
Sodium: 139 mEq/L (ref 135–145)

## 2022-09-14 LAB — LIPID PANEL
Cholesterol: 141 mg/dL (ref 0–200)
HDL: 61.5 mg/dL (ref >39.00)
LDL Cholesterol: 70 mg/dL (ref 0–99)
NonHDL: 79.35
Total CHOL/HDL Ratio: 2
Triglycerides: 46 mg/dL (ref 0.0–149.0)
VLDL: 9.2 mg/dL (ref 0.0–40.0)

## 2022-09-14 LAB — TSH: TSH: 1.85 u[IU]/mL (ref 0.35–5.50)

## 2022-09-14 LAB — HEPATIC FUNCTION PANEL
ALT: 9 U/L (ref 0–35)
AST: 16 U/L (ref 0–37)
Albumin: 4.3 g/dL (ref 3.5–5.2)
Alkaline Phosphatase: 45 U/L (ref 39–117)
Bilirubin, Direct: 0.1 mg/dL (ref 0.0–0.3)
Total Bilirubin: 0.5 mg/dL (ref 0.2–1.2)
Total Protein: 7.5 g/dL (ref 6.0–8.3)

## 2022-09-14 LAB — HEMOGLOBIN A1C: Hgb A1c MFr Bld: 5.1 % (ref 4.6–6.5)

## 2022-09-14 LAB — T3, FREE: T3, Free: 2.6 pg/mL (ref 2.3–4.2)

## 2022-09-14 LAB — T4, FREE: Free T4: 0.92 ng/dL (ref 0.60–1.60)

## 2022-09-14 NOTE — Progress Notes (Signed)
Subjective:    Patient ID: Ruth Phillips, female    DOB: May 30, 2000, 22 y.o.   MRN: [id number]  HPI Here to re-establish and for a well exam. She feels fine. 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 She sees Dr. Lonzo Cloud twice a year for her Grave's disease. This has been well managed with Methimazole. She was having some difficulty passing stools, so she is working with Dr. Orvan Falconer. After PT taught her some pelvic floor exercises, she now passes stools easily. She sees Dr. Sharyn Lull for dermatology care, and she will be removing a lesion on Ruth Phillips's right upper arm in a few weeks.    Review of Systems  Constitutional: Negative.   HENT: Negative.    Eyes: Negative.   Respiratory: Negative.    Cardiovascular: Negative.   Gastrointestinal: Negative.   Genitourinary:  Negative for decreased urine volume, difficulty urinating, dyspareunia, dysuria, enuresis, flank pain, frequency, hematuria, pelvic pain and urgency.  Musculoskeletal: Negative.   Skin: Negative.   Neurological: Negative.  Negative for headaches.  Psychiatric/Behavioral: Negative.         Objective:   Physical Exam Constitutional:      General: She is not in acute distress.    Appearance: Normal appearance. She is well-developed.  HENT:     Head: Normocephalic and atraumatic.     Right Ear: External ear normal.     Left Ear: External ear normal.     Nose: Nose normal.     Mouth/Throat:     Pharynx: No oropharyngeal exudate.  Eyes:     General: No scleral icterus.    Conjunctiva/sclera: Conjunctivae normal.     Pupils: Pupils are equal, round, and reactive to light.  Neck:     Thyroid: No thyromegaly.     Vascular: No JVD.  Cardiovascular:     Rate and Rhythm: Normal rate and regular rhythm.     Pulses: Normal pulses.     Heart sounds: Normal heart sounds. No murmur heard.    No friction rub. No gallop.   Pulmonary:     Effort: Pulmonary effort is normal. No respiratory distress.     Breath sounds: Normal breath sounds. No wheezing or rales.  Chest:     Chest wall: No tenderness.  Abdominal:     General: Bowel sounds are normal. There is no distension.     Palpations: Abdomen is soft. There is no mass.     Tenderness: There is no abdominal tenderness. There is no guarding or rebound.  Musculoskeletal:        General: No tenderness. Normal range of motion.     Cervical back: Normal range of motion and neck supple.  Lymphadenopathy:     Cervical: No cervical adenopathy.  Skin:    General: Skin is warm and dry.     Findings: No erythema or rash.     Comments: There is a 3 mm dark brown firm nodular lesion on the right upper arm   Neurological:     General: No focal deficit present.     Mental Status: She is alert and oriented to person, place, and time.     Cranial Nerves: No cranial nerve deficit.     Motor: No abnormal muscle tone.     Coordination: Coordination normal.     Deep Tendon Reflexes: Reflexes are normal and symmetric. Reflexes normal.  Psychiatric:  Mood and Affect: Mood normal.        Behavior: Behavior normal.        Thought Content: Thought content normal.        Judgment: Judgment normal.           Assessment & Plan:  Well exam. We discussed diet and exercise. Get fasting labs. Gershon Crane, MD

## 2022-09-14 NOTE — Addendum Note (Signed)
Addended by: Carola Rhine on: 09/14/2022 09:58 AM   Modules accepted: Orders

## 2022-09-25 DIAGNOSIS — L700 Acne vulgaris: Secondary | ICD-10-CM | POA: Diagnosis not present

## 2022-09-25 DIAGNOSIS — B078 Other viral warts: Secondary | ICD-10-CM | POA: Diagnosis not present

## 2022-09-25 DIAGNOSIS — L280 Lichen simplex chronicus: Secondary | ICD-10-CM | POA: Diagnosis not present

## 2022-09-25 DIAGNOSIS — D2361 Other benign neoplasm of skin of right upper limb, including shoulder: Secondary | ICD-10-CM | POA: Diagnosis not present

## 2022-10-11 ENCOUNTER — Telehealth: Payer: Self-pay | Admitting: Podiatry

## 2022-10-11 NOTE — Telephone Encounter (Signed)
Lmom for pt to call back to schedule picking up orthotics   Balance pending insurance

## 2023-02-12 ENCOUNTER — Ambulatory Visit: Payer: Federal, State, Local not specified - PPO | Admitting: Internal Medicine

## 2023-02-12 NOTE — Progress Notes (Deleted)
Name: Ruth Phillips  MRN/ DOB: [id number], [birth year]    Age/ Sex: 22 y.o., female     PCP: Nelwyn Salisbury, MD   Reason for Endocrinology Evaluation: Hyperthyroidism     Initial Endocrinology Clinic Visit: 10/13/2018    PATIENT IDENTIFIER: Ruth Phillips is a 22 y.o., female with unremarkable past medical history. She has followed with [facility] since 10/13/2018 for consultative assistance with management of her hyperthyroidism     HISTORICAL SUMMARY:  The patient was first diagnosed with hyperthyroidism Secondary to Graves' Disease in 09/2018  after she noted an enlarging thyroid gland a few months prior to her presentation.   A thyroid ultrasound on 09/25/2018 shows a heterogenous gland with a left inferior nodule , separate from the thyroid gland, a CT scan of the neck demonstrated a17 x 21 mm soft tissue nodule anterior mediastinum just above the left innominate vein and below the left lobe of the thyroid corresponds to the ultrasound abnormality. This was deemed to be a thymus gland.    Methimazole was started 09/2018 , switched to PTU in 08/2019 due to metalic taste but du to elevated LFT's and declining RAI she opted to switch back to Methimazole by 02/2021   LFT's normalized after switching to methimazole     Mother with Grave's disease S/P RAI , maternal Grandfather with hyperthyroidism    SUBJECTIVE:    Today (02/12/2023):  Ruth Phillips is here for a follow up on hyperthyroidism secondary to Graves' Disease. She has NOT been to our clinic in 14 month.   She has been following with podiatry Delford Field continues to fluctuate  Denies palpitations Denies tremors  Continue with loose stools  Denies neck swelling Denies burning and itching  She is on OCP's     Methimazole 5 mg daily     HISTORY:  Past Medical History:  Past Medical History:  Diagnosis Date   Allergy    Graves disease    sees Dr. Artist Beach)    Past Surgical  History:  Past Surgical History:  Procedure Laterality Date   COLONOSCOPY  07/21/2021   per Dr. Orvan Falconer, ext hemorrhoids only, repeat at age 43   Social History:  reports that she has never smoked. She has never used smokeless tobacco. She reports that she does not drink alcohol and does not use drugs. Family History:  Family History  Problem Relation Age of Onset   Diabetes Mother    Thyroid disease Mother    Asthma Father      HOME MEDICATIONS: Allergies as of 02/12/2023   No Known Allergies      Medication List        Accurate as of February 12, 2023  6:50 AM. If you have any questions, ask your nurse or doctor.          Lo Loestrin Fe 1 MG-10 MCG / 10 MCG tablet Generic drug: Norethindrone-Ethinyl Estradiol-Fe Biphas Take 1 tablet by mouth daily.   methimazole 5 MG tablet Commonly known as: TAPAZOLE Take 1 tablet (5 mg total) by mouth as directed. Take 1 tablet Mondays through Saturdays and none on Sundays   XYZAL PO Take 1 tablet by mouth at bedtime.          OBJECTIVE:   PHYSICAL EXAM: VS: There were no vitals taken for this visit.   EXAM: General: Pt appears well and is in NAD  Neck: General: Supple without adenopathy. Thyroid: Thyroid size ~ 40 grams .  No nodules appreciated.  Right thyroid bruit detected   Lungs: Clear with good BS bilat with no rales, rhonchi, or wheezes  Heart: Auscultation: RRR.  Abdomen: Normoactive bowel sounds, soft, nontender, without masses or organomegaly palpable  Extremities:  BL LE: No pretibial edema normal ROM and strength.  Mental Status: Judgment, insight: Intact Orientation: Oriented to time, place, and person Mood and affect: No depression, anxiety, or agitation     DATA REVIEWED:     Latest Reference Range & Units 09/14/22 09:25  Sodium 135 - 145 mEq/L 139  Potassium 3.5 - 5.1 mEq/L 3.9  Chloride 96 - 112 mEq/L 104  CO2 19 - 32 mEq/L 27  Glucose 70 - 99 mg/dL 85  BUN 6 - 23 mg/dL 12   Creatinine 1.30 - 1.20 mg/dL 8.65  Calcium 8.4 - 78.4 mg/dL 9.6  Alkaline Phosphatase 39 - 117 U/L 45  Albumin 3.5 - 5.2 g/dL 4.3  AST 0 - 37 U/L 16  ALT 0 - 35 U/L 9  Total Protein 6.0 - 8.3 g/dL 7.5  Bilirubin, Direct 0.0 - 0.3 mg/dL 0.1  Total Bilirubin 0.2 - 1.2 mg/dL 0.5  GFR >69.62 mL/min 107.67    Latest Reference Range & Units 09/14/22 09:25  WBC 4.0 - 10.5 K/uL 3.7 (L)  RBC 3.87 - 5.11 Mil/uL 4.61  Hemoglobin 12.0 - 15.0 g/dL 95.2  HCT 84.1 - 32.4 % 41.5  MCV 78.0 - 100.0 fl 90.1  MCHC 30.0 - 36.0 g/dL 40.1  RDW 02.7 - 25.3 % 13.0  Platelets 150.0 - 400.0 K/uL 272.0  Neutrophils 43.0 - 77.0 % 42.4 (L)  Lymphocytes 12.0 - 46.0 % 41.7  Monocytes Relative 3.0 - 12.0 % 11.5  Eosinophil 0.0 - 5.0 % 3.8  Basophil 0.0 - 3.0 % 0.6  NEUT# 1.4 - 7.7 K/uL 1.6  Lymphs Abs 0.7 - 4.0 K/uL 1.5  Monocyte # 0.1 - 1.0 K/uL 0.4  Eosinophils Absolute 0.0 - 0.7 K/uL 0.1  Basophils Absolute 0.0 - 0.1 K/uL 0.0    Latest Reference Range & Units 09/14/22 09:25  TSH 0.35 - 5.50 uIU/mL 1.85  Triiodothyronine,Free,Serum 2.3 - 4.2 pg/mL 2.6  T4,Free(Direct) 0.60 - 1.60 ng/dL 6.64     Results for JALAIA, HUGULEY (MRN [id number]) as of 12/08/2018 10:46  Ref. Range 10/10/2018 15:11  TRAB Latest Ref Range: <=2.00 IU/L 11.36 (H)    ASSESSMENT / PLAN / RECOMMENDATIONS:   Hyperthyroidism Secondary to Graves' Disease:   - Pt is clinically euthyroid  - She continues to decline RAI ablation  - TSH is elevated, will reduce methimazole as below    Medications  Take Methimazole 5 mg, 1 tablet MOnday through Saturday only ( none on Sundays)     2. Graves' Disease:  - No extra thyroidal manifestations of graves' disease.    3.  Elevated liver enzymes:  -This was related to  PTU - LFT's normalized with switching to Methimazole   F/U in 6 months    Signed electronically by: Lyndle Herrlich, MD  [facility]  [facility] [address] [place] [place], [place] [address] Phone: [phone] FAX: [phone]      CC: Nelwyn Salisbury, MD [address] [place] [address] Phone: [phone]  Fax: [phone]   Return to Endocrinology clinic as below: Future Appointments  Date Time Provider Department Center  02/12/2023  7:50 AM Channel Papandrea, Konrad Dolores, MD [id number] None

## 2023-03-26 ENCOUNTER — Ambulatory Visit: Payer: Federal, State, Local not specified - PPO | Admitting: Internal Medicine

## 2023-03-26 VITALS — BP 120/70 | HR 74 | Ht 65.0 in | Wt 141.0 lb

## 2023-03-26 DIAGNOSIS — E0500 Thyrotoxicosis with diffuse goiter without thyrotoxic crisis or storm: Secondary | ICD-10-CM

## 2023-03-26 DIAGNOSIS — E0590 Thyrotoxicosis, unspecified without thyrotoxic crisis or storm: Secondary | ICD-10-CM

## 2023-03-26 DIAGNOSIS — D709 Neutropenia, unspecified: Secondary | ICD-10-CM | POA: Diagnosis not present

## 2023-03-26 NOTE — Progress Notes (Signed)
 Name: Ruth Phillips  MRN/ DOB: [id number], 01-29-2001    Age/ Sex: 23 y.o., female     PCP: Johnny Garnette LABOR, MD   Reason for Endocrinology Evaluation: Hyperthyroidism     Initial Endocrinology Clinic Visit: 10/13/2018    PATIENT IDENTIFIER: Ruth Phillips is a 23 y.o., female with unremarkable past medical history. She has followed with [facility] since 10/13/2018 for consultative assistance with management of her hyperthyroidism     HISTORICAL SUMMARY:  The patient was first diagnosed with hyperthyroidism Secondary to Graves' Disease in 09/2018  after she noted an enlarging thyroid  gland a few months prior to her presentation.   A thyroid  ultrasound on 09/25/2018 shows a heterogenous gland with a left inferior nodule , separate from the thyroid  gland, a CT scan of the neck demonstrated a17 x 21 mm soft tissue nodule anterior mediastinum just above the left innominate vein and below the left lobe of the thyroid  corresponds to the ultrasound abnormality. This was deemed to be a thymus gland.    Methimazole  was started 09/2018 , switched to PTU in 08/2019 due to metalic taste but du to elevated LFT's and declining RAI she opted to switch back to Methimazole  by 02/2021   LFT's normalized after switching to methimazole      Mother with Grave's disease S/P RAI , maternal Grandfather with hyperthyroidism    SUBJECTIVE:    Today (03/26/2023):  Ruth Phillips is here for a follow up on hyperthyroidism secondary to Graves' Disease.    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 Sam Spike)    Past Surgical History:  Past Surgical History:  Procedure Laterality Date   COLONOSCOPY  07/21/2021   per Dr. Eda, ext hemorrhoids only, repeat at age 57   Social History:  reports that she has never smoked. She has never used smokeless tobacco. She reports that she does not drink alcohol and does not use drugs. Family History:  Family History  Problem Relation Age of Onset   Diabetes Mother    Thyroid  disease Mother    Asthma Father      HOME MEDICATIONS: Allergies as of 03/26/2023   No Known Allergies      Medication List        Accurate as of March 26, 2023  9:18 AM. If you have any questions, ask your nurse or doctor.          Lo Loestrin Fe 1 MG-10 MCG / 10 MCG tablet Generic drug: Norethindrone-Ethinyl Estradiol-Fe Biphas Take 1 tablet by mouth daily.   methimazole  5 MG tablet Commonly known as: TAPAZOLE  Take 1 tablet (5 mg total) by mouth as directed. Take 1 tablet Mondays through Saturdays and none on Sundays   tretinoin 0.025 % cream Commonly known as: RETIN-A Apply topically daily.   valACYclovir 500 MG tablet Commonly known as: VALTREX Take 500 mg by mouth 2 (two) times daily.   XYZAL PO Take 1 tablet by mouth at  bedtime.          OBJECTIVE:   PHYSICAL EXAM: VS: BP 120/70 (BP Location: Left Arm, Patient Position: Sitting, Cuff Size: Normal)   Pulse 74   Ht 5' 5 (1.651 m)   Wt 141 lb (64 kg)   SpO2 98%   BMI 23.46 kg/m    EXAM: General: Pt appears well and is in NAD  Neck: General: Supple without adenopathy. Thyroid :   No nodules appreciated.  Right thyroid  bruit detected   Lungs: Clear with good BS bilat   Heart: Auscultation: RRR.  Abdomen: Soft, nontender  Extremities:  BL LE: No pretibial edema  Mental Status: Judgment, insight: Intact Orientation: Oriented to time, place, and person Mood and affect: No depression, anxiety, or agitation     DATA  REVIEWED:   Latest Reference Range & Units 03/26/23 09:38  WBC 3.8 - 10.8 Thousand/uL 4.2  RBC 3.80 - 5.10 Million/uL 4.56  Hemoglobin 11.7 - 15.5 g/dL 86.1  HCT 64.9 - 54.9 % 40.5  MCV 80.0 - 100.0 fL 88.8  MCH 27.0 - 33.0 pg 30.3  MCHC 32.0 - 36.0 g/dL 65.8  RDW 88.9 - 84.9 % 12.0  Platelets 140 - 400 Thousand/uL 288  MPV 7.5 - 12.5 fL 10.3  Neutrophils % 58.2  Monocytes Relative % 8.3  Eosinophil % 2.4  Basophil % 0.5  NEUT# 1,500 - 7,800 cells/uL 2,444  Total Lymphocyte % 30.6  Eosinophils Absolute 15 - 500 cells/uL 101  Basophils Absolute 0 - 200 cells/uL 21  Absolute Monocytes 200 - 950 cells/uL 349    Latest Reference Range & Units 03/26/23 09:38  TSH mIU/L 0.92  Triiodothyronine,Free,Serum 2.3 - 4.2 pg/mL 3.1  T4,Free(Direct) 0.8 - 1.8 ng/dL 1.2      Latest Reference Range & Units 09/14/22 09:25  Sodium 135 - 145 mEq/L 139  Potassium 3.5 - 5.1 mEq/L 3.9  Chloride 96 - 112 mEq/L 104  CO2 19 - 32 mEq/L 27  Glucose 70 - 99 mg/dL 85  BUN 6 - 23 mg/dL 12  Creatinine 9.59 - 8.79 mg/dL 9.21  Calcium 8.4 - 89.4 mg/dL 9.6  Alkaline Phosphatase 39 - 117 U/L 45  Albumin 3.5 - 5.2 g/dL 4.3  AST 0 - 37 U/L 16  ALT 0 - 35 U/L 9  Total Protein 6.0 - 8.3 g/dL 7.5  Bilirubin, Direct 0.0 - 0.3 mg/dL 0.1  Total Bilirubin 0.2 - 1.2 mg/dL 0.5  GFR >39.99 mL/min 107.67    Latest Reference Range & Units 09/14/22 09:25  WBC 4.0 - 10.5 K/uL 3.7 (L)  RBC 3.87 - 5.11 Mil/uL 4.61  Hemoglobin 12.0 - 15.0 g/dL 85.9  HCT 63.9 - 53.9 % 41.5  MCV 78.0 - 100.0 fl 90.1  MCHC 30.0 - 36.0 g/dL 66.2  RDW 88.4 - 84.4 % 13.0  Platelets 150.0 - 400.0 K/uL 272.0  Neutrophils 43.0 - 77.0 % 42.4 (L)  Lymphocytes 12.0 - 46.0 % 41.7  Monocytes Relative 3.0 - 12.0 % 11.5  Eosinophil 0.0 - 5.0 % 3.8  Basophil 0.0 - 3.0 % 0.6  NEUT# 1.4 - 7.7 K/uL 1.6  Lymphs Abs 0.7 - 4.0 K/uL 1.5  Monocyte # 0.1 - 1.0 K/uL 0.4  Eosinophils Absolute 0.0 - 0.7 K/uL 0.1  Basophils Absolute 0.0 - 0.1 K/uL 0.0   (L): Data is abnormally low   Results for Ruth Phillips, Ruth Phillips (MRN [id number]) as of 12/08/2018 10:46  Ref. Range 10/10/2018 15:11  TRAB Latest Ref Range: <=2.00 IU/L 11.36 (H)  ASSESSMENT / PLAN / RECOMMENDATIONS:   Hyperthyroidism Secondary to Graves' Disease:   - Pt is clinically euthyroid  - She has declined  RAI ablation in the past -Preconception counseling was done today, patient to notify our office to switch to PTU -She admits to imperfect adherence to methimazole , I have recommended a pillbox -She did have elevated LFTs while on PTU -TFTs remain normal, no change  Medications  Take Methimazole  5 mg, 1 tablet MOnday through Saturday only ( none on Sundays)     2. Graves' Disease:  - No extra thyroidal manifestations of graves' disease.    3.  Neutropenia -She was noted with slight decrease in WBC and neutrophil count in June 2024 -Repeat labs today show normalization  F/U in 6 months    Signed electronically by: Stefano Redgie Butts, MD  [facility]  [facility] [address] [place] [place], [place] [address] Phone: [phone] FAX: [phone]      CC: Johnny Garnette LABOR, MD [address] [place] [address] Phone: [phone]  Fax: [phone]   Return to Endocrinology clinic as below: No future appointments.

## 2023-03-27 LAB — CBC WITH DIFFERENTIAL/PLATELET
Absolute Lymphocytes: 1285 {cells}/uL (ref 850–3900)
Absolute Monocytes: 349 {cells}/uL (ref 200–950)
Basophils Absolute: 21 {cells}/uL (ref 0–200)
Basophils Relative: 0.5 %
Eosinophils Absolute: 101 {cells}/uL (ref 15–500)
Eosinophils Relative: 2.4 %
HCT: 40.5 % (ref 35.0–45.0)
Hemoglobin: 13.8 g/dL (ref 11.7–15.5)
MCH: 30.3 pg (ref 27.0–33.0)
MCHC: 34.1 g/dL (ref 32.0–36.0)
MCV: 88.8 fL (ref 80.0–100.0)
MPV: 10.3 fL (ref 7.5–12.5)
Monocytes Relative: 8.3 %
Neutro Abs: 2444 {cells}/uL (ref 1500–7800)
Neutrophils Relative %: 58.2 %
Platelets: 288 10*3/uL (ref 140–400)
RBC: 4.56 10*6/uL (ref 3.80–5.10)
RDW: 12 % (ref 11.0–15.0)
Total Lymphocyte: 30.6 %
WBC: 4.2 10*3/uL (ref 3.8–10.8)

## 2023-03-27 LAB — TSH: TSH: 0.92 m[IU]/L

## 2023-03-27 LAB — T3, FREE: T3, Free: 3.1 pg/mL (ref 2.3–4.2)

## 2023-03-27 LAB — T4, FREE: Free T4: 1.2 ng/dL (ref 0.8–1.8)

## 2023-03-27 MED ORDER — METHIMAZOLE 5 MG PO TABS: 5.0000 mg | ORAL_TABLET | ORAL | 3 refills | Status: DC

## 2023-06-26 ENCOUNTER — Other Ambulatory Visit: Payer: Self-pay | Admitting: Internal Medicine

## 2023-09-27 ENCOUNTER — Encounter: Payer: Self-pay | Admitting: Internal Medicine

## 2023-09-27 ENCOUNTER — Ambulatory Visit: Payer: Federal, State, Local not specified - PPO | Admitting: Internal Medicine

## 2023-09-27 VITALS — BP 120/80 | HR 94 | Ht 65.0 in | Wt 141.0 lb

## 2023-09-27 DIAGNOSIS — E0500 Thyrotoxicosis with diffuse goiter without thyrotoxic crisis or storm: Secondary | ICD-10-CM

## 2023-09-27 DIAGNOSIS — E0590 Thyrotoxicosis, unspecified without thyrotoxic crisis or storm: Secondary | ICD-10-CM | POA: Diagnosis not present

## 2023-09-27 LAB — T4, FREE: Free T4: 1.4 ng/dL (ref 0.8–1.8)

## 2023-09-27 LAB — TSH: TSH: 5.11 m[IU]/L — ABNORMAL HIGH

## 2023-09-27 LAB — T3, FREE: T3, Free: 3.1 pg/mL (ref 2.3–4.2)

## 2023-09-27 NOTE — Progress Notes (Unsigned)
 Name: Ruth Phillips  MRN/ DOB: [id number], 01-18-2001    Age/ Sex: 23 y.o., female     PCP: Johnny Garnette LABOR, MD   Reason for Endocrinology Evaluation: Hyperthyroidism     Initial Endocrinology Clinic Visit: 10/13/2018    PATIENT IDENTIFIER: Ruth Phillips is a 23 y.o., female with unremarkable past medical history. She has followed with [facility] since 10/13/2018 for consultative assistance with management of her hyperthyroidism     HISTORICAL SUMMARY:  The patient was first diagnosed with hyperthyroidism Secondary to Graves' Disease in 09/2018  after she noted an enlarging thyroid  gland a few months prior to her presentation.   A thyroid  ultrasound on 09/25/2018 shows a heterogenous gland with a left inferior nodule , separate from the thyroid  gland, a CT scan of the neck demonstrated a17 x 21 mm soft tissue nodule anterior mediastinum just above the left innominate vein and below the left lobe of the thyroid  corresponds to the ultrasound abnormality. This was deemed to be a thymus gland.    Methimazole  was started 09/2018 , switched to PTU in 08/2019 due to metalic taste but du to elevated LFT's and declining RAI she opted to switch back to Methimazole  by 02/2021   LFT's normalized after switching to methimazole      Mother with Grave's disease S/P RAI , maternal Grandfather with hyperthyroidism    SUBJECTIVE:    Today (09/27/2023):  Ruth Phillips is here for a follow up on hyperthyroidism secondary to Graves' Disease.   No local neck swelling  Denies palpitations  Denies abdominal abdominal pain, or nausea  No loose stools or constipation  Mild hand tremors She is  not on COC's LMP 6/14 No eye symptoms   Methimazole  5 mg , 1 tablet Monday through Saturday, none on Sundays    HISTORY:  Past Medical History:  Past Medical History:  Diagnosis Date   Allergy    Graves disease    sees Dr. Sam Spike)    Past Surgical History:   Past Surgical History:  Procedure Laterality Date   COLONOSCOPY  07/21/2021   per Dr. Eda, ext hemorrhoids only, repeat at age 79   Social History:  reports that she has never smoked. She has never used smokeless tobacco. She reports that she does not drink alcohol and does not use drugs. Family History:  Family History  Problem Relation Age of Onset   Diabetes Mother    Thyroid  disease Mother    Asthma Father      HOME MEDICATIONS: Allergies as of 09/27/2023   No Known Allergies      Medication List        Accurate as of September 27, 2023  7:51 AM. If you have any questions, ask your nurse or doctor.          Lo Loestrin Fe 1 MG-10 MCG / 10 MCG tablet Generic drug: Norethindrone-Ethinyl Estradiol-Fe Biphas Take 1 tablet by mouth daily.   methimazole  5 MG tablet Commonly known as: TAPAZOLE  TAKE 1 TABLET BY MOUTH MONDAY THROUGH SATURDAY AND NO TABLETS ON SUNDAY   tretinoin 0.025 % cream Commonly known as: RETIN-A Apply topically daily.   valACYclovir 500 MG tablet Commonly known as: VALTREX Take 500 mg by mouth 2 (two) times daily.   XYZAL PO Take 1 tablet by mouth at bedtime.          OBJECTIVE:   PHYSICAL EXAM: VS: BP 120/80 (BP Location: Right Arm, Patient Position: Sitting, Cuff Size:  Normal)   Pulse 94   Ht 5' 5 (1.651 m)   Wt 141 lb (64 kg)   BMI 23.46 kg/m    EXAM: General: Pt appears well and is in NAD   Neck: General: Supple without adenopathy. Thyroid :   No nodules appreciated.    Lungs: Clear with good BS bilat   Heart: Auscultation: RRR.  Abdomen: Soft, nontender  Extremities:  BL LE: No pretibial edema  Mental Status: Judgment, insight: Intact Orientation: Oriented to time, place, and person Mood and affect: No depression, anxiety, or agitation     DATA REVIEWED:  *****  Results for Ruth Phillips, Ruth Phillips (MRN [id number]) as of 12/08/2018 10:46  Ref. Range 10/10/2018 15:11  TRAB Latest Ref Range: <=2.00 IU/L 11.36 (H)     ASSESSMENT / PLAN / RECOMMENDATIONS:   Hyperthyroidism Secondary to Graves' Disease:   - Pt is clinically euthyroid  - She has declined  RAI ablation in the past -Preconception counseling was done today, patient to notify our office to switch to PTU -She admits to imperfect adherence to methimazole , I have recommended a pillbox -She did have elevated LFTs while on PTU -TFTs remain normal, no change  Medications  Take Methimazole  5 mg, 1 tablet MOnday through Saturday only ( none on Sundays)     2. Graves' Disease:  - No extra thyroidal manifestations of graves' disease.      F/U in 6 months    Signed electronically by: Stefano Redgie Butts, MD  [facility]  [facility] [address] [place] [place], [place] [address] Phone: [phone] FAX: [phone]      CC: Johnny Garnette LABOR, MD [address] [place] [address] Phone: [phone]  Fax: [phone]   Return to Endocrinology clinic as below: No future appointments.

## 2023-09-28 ENCOUNTER — Other Ambulatory Visit: Payer: Self-pay | Admitting: Internal Medicine

## 2023-09-30 ENCOUNTER — Ambulatory Visit: Payer: Self-pay | Admitting: Internal Medicine

## 2023-09-30 MED ORDER — METHIMAZOLE 5 MG PO TABS: 5.0000 mg | ORAL_TABLET | ORAL | 3 refills | Status: DC

## 2023-12-26 DIAGNOSIS — Z6823 Body mass index (BMI) 23.0-23.9, adult: Secondary | ICD-10-CM | POA: Diagnosis not present

## 2023-12-26 DIAGNOSIS — Z113 Encounter for screening for infections with a predominantly sexual mode of transmission: Secondary | ICD-10-CM | POA: Diagnosis not present

## 2023-12-26 DIAGNOSIS — Z01419 Encounter for gynecological examination (general) (routine) without abnormal findings: Secondary | ICD-10-CM | POA: Diagnosis not present

## 2024-01-10 DIAGNOSIS — Z30430 Encounter for insertion of intrauterine contraceptive device: Secondary | ICD-10-CM | POA: Diagnosis not present

## 2024-01-23 ENCOUNTER — Other Ambulatory Visit: Payer: Self-pay

## 2024-01-23 MED ORDER — METHIMAZOLE 5 MG PO TABS: 5.0000 mg | ORAL_TABLET | ORAL | 3 refills | Status: DC

## 2024-02-21 DIAGNOSIS — Z30431 Encounter for routine checking of intrauterine contraceptive device: Secondary | ICD-10-CM | POA: Diagnosis not present

## 2024-03-23 ENCOUNTER — Ambulatory Visit: Admitting: Internal Medicine

## 2024-03-23 ENCOUNTER — Encounter: Payer: Self-pay | Admitting: Internal Medicine

## 2024-03-23 ENCOUNTER — Other Ambulatory Visit

## 2024-03-23 VITALS — BP 106/70 | HR 89 | Ht 65.0 in | Wt 142.0 lb

## 2024-03-23 DIAGNOSIS — L442 Lichen striatus: Secondary | ICD-10-CM | POA: Insufficient documentation

## 2024-03-23 DIAGNOSIS — E0590 Thyrotoxicosis, unspecified without thyrotoxic crisis or storm: Secondary | ICD-10-CM | POA: Diagnosis not present

## 2024-03-23 DIAGNOSIS — E0500 Thyrotoxicosis with diffuse goiter without thyrotoxic crisis or storm: Secondary | ICD-10-CM

## 2024-03-23 DIAGNOSIS — L700 Acne vulgaris: Secondary | ICD-10-CM | POA: Insufficient documentation

## 2024-03-23 DIAGNOSIS — L659 Nonscarring hair loss, unspecified: Secondary | ICD-10-CM | POA: Insufficient documentation

## 2024-03-23 NOTE — Progress Notes (Signed)
 "  Name: Ruth Phillips  MRN/ DOB: [id number], 02-19-2001    Age/ Sex: 24 y.o., female     PCP: Johnny Garnette LABOR, MD   Reason for Endocrinology Evaluation: Hyperthyroidism     Initial Endocrinology Clinic Visit: 10/13/2018    PATIENT IDENTIFIER: [name] is a 24 y.o., female with unremarkable past medical history. She has followed with [facility] since 10/13/2018 for consultative assistance with management of her hyperthyroidism     HISTORICAL SUMMARY:  The patient was first diagnosed with hyperthyroidism Secondary to Graves' Disease in 09/2018  after she noted an enlarging thyroid  gland a few months prior to her presentation.   A thyroid  ultrasound on 09/25/2018 shows a heterogenous gland with a left inferior nodule , separate from the thyroid  gland, a CT scan of the neck demonstrated a17 x 21 mm soft tissue nodule anterior mediastinum just above the left innominate vein and below the left lobe of the thyroid  corresponds to the ultrasound abnormality. This was deemed to be a thymus gland.    Methimazole  was started 09/2018 , switched to PTU in 08/2019 due to metalic taste but du to elevated LFT's and declining RAI she opted to switch back to Methimazole  by 02/2021   LFT's normalized after switching to methimazole      Mother with Grave's disease S/P RAI , maternal Grandfather with hyperthyroidism    SUBJECTIVE:    Today (03/23/2024):  Ruth Phillips is here for a follow up on hyperthyroidism secondary to Graves' Disease.    Weight remains stable No local neck swelling  No constipation or diarrhea  No palpitations  No eye symptoms -eye exam was last week  The patient has IUD in place  She did move back here in August from [place] .  She is moving back to [place]  this upcoming Sunday  Methimazole  5 mg , 1 tablet Monday through Friday, none on Saturdays or Sundays    HISTORY:  Past Medical History:  Past Medical History:  Diagnosis Date   Allergy     Graves disease    sees Dr. Sam Spike)    Past Surgical History:  Past Surgical History:  Procedure Laterality Date   COLONOSCOPY  07/21/2021   per Dr. Eda, ext hemorrhoids only, repeat at age 29   Social History:  reports that she has never smoked. She has never used smokeless tobacco. She reports that she does not drink alcohol and does not use drugs. Family History:  Family History  Problem Relation Age of Onset   Diabetes Mother    Thyroid  disease Mother    Asthma Father      HOME MEDICATIONS: Allergies as of 03/23/2024   No Known Allergies      Medication List        Accurate as of March 23, 2024  9:23 AM. If you have any questions, ask your nurse or doctor.          STOP taking these medications    Lo Loestrin Fe 1 MG-10 MCG / 10 MCG tablet Generic drug: Norethindrone-Ethinyl Estradiol-Fe Biphas Stopped by: Donell Sam, MD   tretinoin 0.025 % cream Commonly known as: RETIN-A Stopped by: Donell Sam, MD   XYZAL PO Stopped by: Donell Sam, MD       TAKE these medications    methimazole  5 MG tablet Commonly known as: TAPAZOLE  Take 1 tablet (5 mg total) by mouth as directed. 1 tablet 5 days a week only   valACYclovir 500  MG tablet Commonly known as: VALTREX Take 500 mg by mouth 2 (two) times daily.          OBJECTIVE:   PHYSICAL EXAM: VS: BP 106/70   Pulse 89   Ht 5' 5 (1.651 m)   Wt 142 lb (64.4 kg)   SpO2 99%   BMI 23.63 kg/m    EXAM: General: Pt appears well and is in NAD   Neck: General: Supple without adenopathy. Thyroid :   No nodules appreciated.    Lungs: Clear with good BS bilat   Heart: Auscultation: RRR.  Abdomen: Soft, nontender  Extremities:  BL LE: No pretibial edema  Mental Status: Judgment, insight: Intact Orientation: Oriented to time, place, and person Mood and affect: No depression, anxiety, or agitation     DATA REVIEWED:   Latest Reference Range & Units  03/23/24 09:39  AG Ratio 1.0 - 2.5 (calc) 1.4  AST 10 - 30 U/L 17  ALT 6 - 29 U/L 11  Total Protein 6.1 - 8.1 g/dL 7.1  Bilirubin, Direct 0.0 - 0.2 mg/dL 0.1  Indirect Bilirubin 0.2 - 1.2 mg/dL (calc) 0.3  Total Bilirubin 0.2 - 1.2 mg/dL 0.4  Alkaline phosphatase (APISO) 31 - 125 U/L 49  Globulin 1.9 - 3.7 g/dL (calc) 3.0  WBC 3.8 - 89.1 Thousand/uL 4.3  RBC 3.80 - 5.10 Million/uL 4.48  Hemoglobin 11.7 - 15.5 g/dL 86.5  HCT 64.0 - 53.9 % 39.7  MCV 81.4 - 101.7 fL 88.6  MCH 27.0 - 33.0 pg 29.9  MCHC 31.6 - 35.4 g/dL 66.1  RDW 88.9 - 84.9 % 12.3  Platelets 140 - 400 Thousand/uL 302  MPV 7.5 - 12.5 fL 10.2    Latest Reference Range & Units 03/23/24 09:39  TSH mIU/L 1.05  T4,Free(Direct) 0.8 - 1.8 ng/dL 1.3  Albumin MSPROF 3.6 - 5.1 g/dL 4.1    Results for Phillips, Ruth (MRN [id number]) as of 12/08/2018 10:46  Ref. Range 10/10/2018 15:11  TRAB Latest Ref Range: <=2.00 IU/L 11.36 (H)    ASSESSMENT / PLAN / RECOMMENDATIONS:   Hyperthyroidism Secondary to Graves' Disease:   - Patient is clinically euthyroid - She has declined  RAI ablation in the past -She did have elevated LFTs while on PTU - TFTs remain within normal range, no change  Medications  Take Methimazole  5 mg, 1 tablet MOnday through Friday,  none Saturdays or  Sundays)     2. Graves' Disease:  - No extra thyroidal manifestations of graves' disease.      F/U in 6 months    Signed electronically by: Stefano Redgie Butts, MD  [facility]  [facility] [address] [place] [place], [place] [address] Phone: [phone] FAX: [phone]      CC: Johnny Garnette LABOR, MD [address] [place] [address] Phone: [phone]  Fax: [phone]   Return to Endocrinology clinic as below: Future Appointments  Date Time Provider Department Center  03/25/2024 10:30 AM Johnny Garnette LABOR, MD [facility] [address]       "

## 2024-03-24 ENCOUNTER — Ambulatory Visit: Payer: Self-pay | Admitting: Internal Medicine

## 2024-03-24 LAB — HEPATIC FUNCTION PANEL
AG Ratio: 1.4 (calc) (ref 1.0–2.5)
ALT: 11 U/L (ref 6–29)
AST: 17 U/L (ref 10–30)
Albumin: 4.1 g/dL (ref 3.6–5.1)
Alkaline phosphatase (APISO): 49 U/L (ref 31–125)
Bilirubin, Direct: 0.1 mg/dL (ref 0.0–0.2)
Globulin: 3 g/dL (ref 1.9–3.7)
Indirect Bilirubin: 0.3 mg/dL (ref 0.2–1.2)
Total Bilirubin: 0.4 mg/dL (ref 0.2–1.2)
Total Protein: 7.1 g/dL (ref 6.1–8.1)

## 2024-03-24 LAB — CBC
HCT: 39.7 % (ref 35.9–46.0)
Hemoglobin: 13.4 g/dL (ref 11.7–15.5)
MCH: 29.9 pg (ref 27.0–33.0)
MCHC: 33.8 g/dL (ref 31.6–35.4)
MCV: 88.6 fL (ref 81.4–101.7)
MPV: 10.2 fL (ref 7.5–12.5)
Platelets: 302 Thousand/uL (ref 140–400)
RBC: 4.48 Million/uL (ref 3.80–5.10)
RDW: 12.3 % (ref 11.0–15.0)
WBC: 4.3 Thousand/uL (ref 3.8–10.8)

## 2024-03-24 LAB — T4, FREE: Free T4: 1.3 ng/dL (ref 0.8–1.8)

## 2024-03-24 LAB — TSH: TSH: 1.05 m[IU]/L

## 2024-03-24 MED ORDER — METHIMAZOLE 5 MG PO TABS: 5.0000 mg | ORAL_TABLET | ORAL | 3 refills | Status: AC

## 2024-03-25 ENCOUNTER — Encounter: Admitting: Family Medicine

## 2024-03-27 ENCOUNTER — Encounter: Payer: Self-pay | Admitting: Family Medicine

## 2024-03-27 ENCOUNTER — Ambulatory Visit (INDEPENDENT_AMBULATORY_CARE_PROVIDER_SITE_OTHER): Admitting: Family Medicine

## 2024-03-27 VITALS — BP 110/76 | HR 86 | Temp 98.5°F | Ht 65.0 in | Wt 142.0 lb

## 2024-03-27 DIAGNOSIS — Z0000 Encounter for general adult medical examination without abnormal findings: Secondary | ICD-10-CM | POA: Diagnosis not present

## 2024-03-27 NOTE — Progress Notes (Signed)
" ° °  Subjective:    Patient ID: Ruth Phillips, female    DOB: 04-26-2000, 24 y.o.   MRN: [id number]  HPI Here for a well exam. She feels fine. She sees Dr. Sam for her Graves disease.    Review of Systems  Constitutional: Negative.   HENT: Negative.    Eyes: Negative.   Respiratory: Negative.    Cardiovascular: Negative.   Gastrointestinal: Negative.   Genitourinary:  Negative for decreased urine volume, difficulty urinating, dyspareunia, dysuria, enuresis, flank pain, frequency, hematuria, pelvic pain and urgency.  Musculoskeletal: Negative.   Skin: Negative.   Neurological: Negative.  Negative for headaches.  Psychiatric/Behavioral: Negative.         Objective:   Physical Exam Constitutional:      General: She is not in acute distress.    Appearance: Normal appearance. She is well-developed.  HENT:     Head: Normocephalic and atraumatic.     Right Ear: External ear normal.     Left Ear: External ear normal.     Nose: Nose normal.     Mouth/Throat:     Pharynx: No oropharyngeal exudate.  Eyes:     General: No scleral icterus.    Conjunctiva/sclera: Conjunctivae normal.     Pupils: Pupils are equal, round, and reactive to light.  Neck:     Thyroid : No thyromegaly.     Vascular: No JVD.  Cardiovascular:     Rate and Rhythm: Normal rate and regular rhythm.     Pulses: Normal pulses.     Heart sounds: Normal heart sounds. No murmur heard.    No friction rub. No gallop.  Pulmonary:     Effort: Pulmonary effort is normal. No respiratory distress.     Breath sounds: Normal breath sounds. No wheezing or rales.  Chest:     Chest wall: No tenderness.  Abdominal:     General: Bowel sounds are normal. There is no distension.     Palpations: Abdomen is soft. There is no mass.     Tenderness: There is no abdominal tenderness. There is no guarding or rebound.  Musculoskeletal:        General: No tenderness. Normal range of motion.     Cervical back: Normal range of  motion and neck supple.  Lymphadenopathy:     Cervical: No cervical adenopathy.  Skin:    General: Skin is warm and dry.     Findings: No erythema or rash.  Neurological:     General: No focal deficit present.     Mental Status: She is alert and oriented to person, place, and time.     Cranial Nerves: No cranial nerve deficit.     Motor: No abnormal muscle tone.     Coordination: Coordination normal.     Deep Tendon Reflexes: Reflexes are normal and symmetric. Reflexes normal.  Psychiatric:        Mood and Affect: Mood normal.        Behavior: Behavior normal.        Thought Content: Thought content normal.        Judgment: Judgment normal.           Assessment & Plan:  Well exam. We discussed diet and exercise. Get fasting labs. Garnette Olmsted, MD   "

## 2024-03-28 LAB — CBC WITH DIFFERENTIAL/PLATELET
Absolute Lymphocytes: 1842 {cells}/uL (ref 850–3900)
Absolute Monocytes: 414 {cells}/uL (ref 200–950)
Basophils Absolute: 39 {cells}/uL (ref 0–200)
Basophils Relative: 0.7 %
Eosinophils Absolute: 179 {cells}/uL (ref 15–500)
Eosinophils Relative: 3.2 %
HCT: 42.2 % (ref 35.9–46.0)
Hemoglobin: 14.1 g/dL (ref 11.7–15.5)
MCH: 29.5 pg (ref 27.0–33.0)
MCHC: 33.4 g/dL (ref 31.6–35.4)
MCV: 88.3 fL (ref 81.4–101.7)
MPV: 10.5 fL (ref 7.5–12.5)
Monocytes Relative: 7.4 %
Neutro Abs: 3125 {cells}/uL (ref 1500–7800)
Neutrophils Relative %: 55.8 %
Platelets: 318 Thousand/uL (ref 140–400)
RBC: 4.78 Million/uL (ref 3.80–5.10)
RDW: 12.1 % (ref 11.0–15.0)
Total Lymphocyte: 32.9 %
WBC: 5.6 Thousand/uL (ref 3.8–10.8)

## 2024-03-28 LAB — BASIC METABOLIC PANEL WITH GFR
BUN: 10 mg/dL (ref 7–25)
CO2: 26 mmol/L (ref 20–32)
Calcium: 9.6 mg/dL (ref 8.6–10.2)
Chloride: 104 mmol/L (ref 98–110)
Creat: 0.86 mg/dL (ref 0.50–0.96)
Glucose, Bld: 85 mg/dL (ref 65–99)
Potassium: 4.1 mmol/L (ref 3.5–5.3)
Sodium: 139 mmol/L (ref 135–146)
eGFR: 97 mL/min/1.73m2

## 2024-03-28 LAB — HEMOGLOBIN A1C
Hgb A1c MFr Bld: 5 %
Mean Plasma Glucose: 97 mg/dL
eAG (mmol/L): 5.4 mmol/L

## 2024-03-28 LAB — HEPATIC FUNCTION PANEL
AG Ratio: 1.6 (calc) (ref 1.0–2.5)
ALT: 11 U/L (ref 6–29)
AST: 19 U/L (ref 10–30)
Albumin: 4.7 g/dL (ref 3.6–5.1)
Alkaline phosphatase (APISO): 55 U/L (ref 31–125)
Bilirubin, Direct: 0.2 mg/dL (ref 0.0–0.2)
Globulin: 2.9 g/dL (ref 1.9–3.7)
Indirect Bilirubin: 0.7 mg/dL (ref 0.2–1.2)
Total Bilirubin: 0.9 mg/dL (ref 0.2–1.2)
Total Protein: 7.6 g/dL (ref 6.1–8.1)

## 2024-03-28 LAB — LIPID PANEL
Cholesterol: 148 mg/dL
HDL: 67 mg/dL
LDL Cholesterol (Calc): 68 mg/dL
Non-HDL Cholesterol (Calc): 81 mg/dL
Total CHOL/HDL Ratio: 2.2 (calc)
Triglycerides: 47 mg/dL

## 2024-03-30 ENCOUNTER — Ambulatory Visit: Admitting: Internal Medicine

## 2024-03-30 ENCOUNTER — Ambulatory Visit: Payer: Self-pay | Admitting: Family Medicine

## 2024-03-30 ENCOUNTER — Encounter: Payer: Self-pay | Admitting: Family Medicine

## 2024-03-30 MED ORDER — MUPIROCIN 2 % EX OINT: 1.0000 | TOPICAL_OINTMENT | Freq: Two times a day (BID) | CUTANEOUS | 2 refills | Status: DC

## 2024-03-30 NOTE — Telephone Encounter (Signed)
 FYI Pt has a CPE on 03/27/24 Please advise

## 2024-03-30 NOTE — Telephone Encounter (Signed)
 This was sent in today

## 2024-03-31 ENCOUNTER — Other Ambulatory Visit: Payer: Self-pay

## 2024-03-31 MED ORDER — MUPIROCIN 2 % EX OINT: 1.0000 | TOPICAL_OINTMENT | Freq: Two times a day (BID) | CUTANEOUS | 2 refills | Status: AC

## 2024-09-25 ENCOUNTER — Ambulatory Visit: Admitting: Internal Medicine
